# Patient Record
Sex: Male | Born: 1960 | Race: White | Marital: Married | Smoking: Never smoker
Health system: Southern US, Community
[De-identification: ages and names within clinical notes are randomized; demographics above are authoritative.]

## PROBLEM LIST (undated history)

## (undated) DIAGNOSIS — G4730 Sleep apnea, unspecified: Secondary | ICD-10-CM

## (undated) DIAGNOSIS — M519 Unspecified thoracic, thoracolumbar and lumbosacral intervertebral disc disorder: Secondary | ICD-10-CM

## (undated) DIAGNOSIS — E785 Hyperlipidemia, unspecified: Secondary | ICD-10-CM

## (undated) DIAGNOSIS — I10 Essential (primary) hypertension: Secondary | ICD-10-CM

## (undated) HISTORY — DX: Essential (primary) hypertension: I10

## (undated) HISTORY — DX: Hyperlipidemia, unspecified: E78.5

## (undated) HISTORY — PX: FINGER SURGERY: SHX640

## (undated) HISTORY — DX: Unspecified thoracic, thoracolumbar and lumbosacral intervertebral disc disorder: M51.9

## (undated) HISTORY — PX: TONSILLECTOMY: SUR1361

## (undated) HISTORY — DX: Sleep apnea, unspecified: G47.30

---

## 2020-02-19 ENCOUNTER — Ambulatory Visit: Payer: Self-pay | Attending: Internal Medicine

## 2020-02-19 ENCOUNTER — Other Ambulatory Visit: Payer: Self-pay

## 2020-02-19 DIAGNOSIS — Z23 Encounter for immunization: Secondary | ICD-10-CM

## 2020-02-19 NOTE — Progress Notes (Signed)
   Covid-19 Vaccination Clinic  Name:  Craig Curtis    MRN: [id number] DOB: [birth year]  02/19/2020  Mr. Craig Curtis was observed post Covid-19 immunization for 15 minutes without incident. He was provided with Vaccine Information Sheet and instruction to access the V-Safe system.   Mr. Craig Curtis was instructed to call 911 with any severe reactions post vaccine: Marland Kitchen Difficulty breathing  . Swelling of face and throat  . A fast heartbeat  . A bad rash all over body  . Dizziness and weakness   Immunizations Administered    Name Date Dose VIS Date Route   Pfizer COVID-19 Vaccine 02/19/2020  9:43 AM 0.3 mL 11/21/2019 Intramuscular   Manufacturer: [employer], [employer]   Lot: [id number]   NDC: [address]-4

## 2020-03-15 ENCOUNTER — Ambulatory Visit: Payer: Self-pay | Attending: Internal Medicine

## 2020-03-15 DIAGNOSIS — Z23 Encounter for immunization: Secondary | ICD-10-CM

## 2020-03-15 NOTE — Progress Notes (Signed)
   Covid-19 Vaccination Clinic  Name:  Craig Curtis    MRN: [id number] DOB: 12-Aug-1961  03/15/2020  [name] was observed post Covid-19 immunization for 15 minutes without incident. He was provided with Vaccine Information Sheet and instruction to access the V-Safe system.   [name] was instructed to call 911 with any severe reactions post vaccine: Marland Kitchen Difficulty breathing  . Swelling of face and throat  . A fast heartbeat  . A bad rash all over body  . Dizziness and weakness   Immunizations Administered    Name Date Dose VIS Date Route   Pfizer COVID-19 Vaccine 03/15/2020  9:28 AM 0.3 mL 11/21/2019 Intramuscular   Manufacturer: [employer], [employer]   Lot: [id number]   NDC: [address]-9

## 2021-10-27 ENCOUNTER — Other Ambulatory Visit: Payer: Self-pay | Admitting: Internal Medicine

## 2021-10-27 ENCOUNTER — Ambulatory Visit
Admission: RE | Admit: 2021-10-27 | Discharge: 2021-10-27 | Disposition: A | Discharge status: Home / Self Care | Payer: 59 | Source: Ambulatory Visit | Attending: Internal Medicine | Admitting: Internal Medicine

## 2021-10-27 DIAGNOSIS — R0609 Other forms of dyspnea: Secondary | ICD-10-CM

## 2021-12-15 DIAGNOSIS — R0602 Shortness of breath: Secondary | ICD-10-CM | POA: Insufficient documentation

## 2021-12-15 NOTE — Progress Notes (Addendum)
Cardiology Office Note   Date:  12/16/2021   ID:  Craig Curtis, DOB 1961/07/20, MRN [id number]  PCP:  Georgann Housekeeper, MD  Cardiologist:   Rollene Rotunda, MD Referring:  Georgann Housekeeper, MD  Chief Complaint  Patient presents with   Shortness of Breath      History of Present Illness: Craig Curtis is a 61 y.o. male who was referred by Georgann Housekeeper, MD for evaluation of SOB.  He says that he has been getting short of breath for some weeks or months.  He will notice it when he is climbing 17 stairs in his house.  He will get short of breath and takes a few minutes to recover.  He has not been particularly active.  He rarely gets on a recumbent bike.  His job is more sedentary.  He does not describe PND or orthopnea.  Does have sleep apnea and uses CPAP.  He has gained weight over time and he is often active.  He does not describe substernal chest pressure, neck or arm discomfort.  However, he does get some shooting chest discomfort occasionally and had this on 2 occasions.  This was a more sharp discomfort that was brief and 5/10.  He did not describe associated symptoms radiation to his jaw or to his arms.  He has not had any prior cardiac work-up or history.   Past Medical History:  Diagnosis Date   Dyslipidemia    HTN (hypertension)    Lumbar disc disease    Sleep apnea    CPAP   PSH: Tonsillectomy, mass removed from    Current Outpatient Medications  Medication Sig Dispense Refill   amLODipine (NORVASC) 5 MG tablet Take 5 mg by mouth daily.     aspirin EC 81 MG tablet Take 81 mg by mouth daily. Swallow whole.     atorvastatin (LIPITOR) 40 MG tablet Take 40 mg by mouth daily.     cholecalciferol (VITAMIN D3) 25 MCG (1000 UNIT) tablet Take 1,000 Units by mouth daily.     fenofibrate 54 MG tablet Take 54 mg by mouth daily.     lisinopril (ZESTRIL) 40 MG tablet Take 40 mg by mouth daily.     Melatonin 1 MG CHEW Chew 1 mg by mouth at bedtime as needed.     Multiple Vitamin  (MULTIVITAMIN) tablet Take 1 tablet by mouth daily.     psyllium (METAMUCIL) 58.6 % powder Take 1 packet by mouth 3 (three) times daily. TAKES THE POWDER OR GUMMIES     No current facility-administered medications for this visit.    Allergies:   Patient has no known allergies.    Social History:  The patient  reports that he has never smoked. He has never used smokeless tobacco. He reports that he does not drink alcohol.   Family History:  The patient's family history includes Coronary artery disease (age of onset: 64) in his mother; Diabetes in his mother; Hypertension in his mother.    ROS:  Please see the history of present illness.   Otherwise, review of systems are positive for none.   All other systems are reviewed and negative.    PHYSICAL EXAM: VS:  BP 138/70 (BP Location: Left Arm, Patient Position: Sitting, Cuff Size: Normal)    Pulse 95    Resp 20    Ht 6' (1.829 m)    Wt 280 lb 6.4 oz (127.2 kg)    SpO2 97%    BMI 38.03 kg/m  ,  BMI Body mass index is 38.03 kg/m. GENERAL:  Well appearing HEENT:  Pupils equal round and reactive, fundi not visualized, oral mucosa unremarkable NECK:  No jugular venous distention, waveform within normal limits, carotid upstroke brisk and symmetric, no bruits, no thyromegaly LYMPHATICS:  No cervical, inguinal adenopathy LUNGS:  Clear to auscultation bilaterally BACK:  No CVA tenderness CHEST:  Unremarkable HEART:  PMI not displaced or sustained,S1 and S2 within normal limits, no S3, no S4, no clicks, no rubs, no murmurs ABD:  Flat, positive bowel sounds normal in frequency in pitch, no bruits, no rebound, no guarding, no midline pulsatile mass, no hepatomegaly, no splenomegaly EXT:  2 plus pulses throughout, no edema, no cyanosis no clubbing SKIN:  No rashes no nodules NEURO:  Cranial nerves II through XII grossly intact, motor grossly intact throughout PSYCH:  Cognitively intact, oriented to person place and time    EKG:  EKG is ordered  today. The ekg ordered today demonstrates sinus rhythm, rate 69, axis within normal limits, intervals within normal limits, no acute ST-T wave changes.   Recent Labs: No results found for requested labs within last 8760 hours.    Lipid Panel No results found for: CHOL, TRIG, HDL, CHOLHDL, VLDL, LDLCALC, LDLDIRECT    Wt Readings from Last 3 Encounters:  12/16/21 280 lb 6.4 oz (127.2 kg)      Other studies Reviewed: Additional studies/ records that were reviewed today include: Labs and PCP note. Review of the above records demonstrates:  Please see elsewhere in the note.     ASSESSMENT AND PLAN:  SOB:  I am going to start with a coronary calcium score.  Pending this result I will either order a POET (Plain Old Exercise Treadmill) or other testing.  I am not suspecting heart failure.  This could be related to weight and deconditioning.  CHEST PAIN: He has had some mild discomfort sounds nonanginal but will be screened as above.  DYSLIPIDEMIA: LDL was 83 with an HDL of 39.  Goals of therapy will be based on the results of the above.  HTN: The blood pressure has been borderline.  Hopefully with diet and exercise he will get down to target more routinely.  OBESITY:  We had a long discussion about diet and exercise.    Current medicines are reviewed at length with the patient today.  The patient does not have concerns regarding medicines.  The following changes have been made:  no change  Labs/ tests ordered today include:   Orders Placed This Encounter  Procedures   CT CARDIAC SCORING (SELF PAY ONLY)   EKG 12-Lead     Disposition:   FU with me in one month.     Signed, Rollene Rotunda, MD  12/16/2021 3:05 PM    [facility]

## 2021-12-16 ENCOUNTER — Ambulatory Visit: Payer: 59 | Admitting: Cardiology

## 2021-12-16 ENCOUNTER — Encounter: Payer: Self-pay | Admitting: Cardiology

## 2021-12-16 ENCOUNTER — Other Ambulatory Visit: Payer: Self-pay

## 2021-12-16 VITALS — BP 138/70 | HR 95 | Resp 20 | Ht 72.0 in | Wt 280.4 lb

## 2021-12-16 DIAGNOSIS — R0602 Shortness of breath: Secondary | ICD-10-CM | POA: Diagnosis not present

## 2021-12-16 NOTE — Patient Instructions (Signed)
Medication Instructions:  Continue same medications   Lab Work: None ordered   Testing/Procedures: Coronary Calcium Score   Follow-Up: At [facility], you and your health needs are our priority.  As part of our continuing mission to provide you with exceptional heart care, we have created designated Provider Care Teams.  These Care Teams include your primary Cardiologist (physician) and Advanced Practice Providers (APPs -  Physician Assistants and Nurse Practitioners) who all work together to provide you with the care you need, when you need it.  We recommend signing up for the patient portal called "MyChart".  Sign up information is provided on this After Visit Summary.  MyChart is used to connect with patients for Virtual Visits (Telemedicine).  Patients are able to view lab/test results, encounter notes, upcoming appointments, etc.  Non-urgent messages can be sent to your provider as well.   To learn more about what you can do with MyChart, go to [email or web address].      Your next appointment:  1 month    The format for your next appointment: Office   Provider:  Dr.Hochrein

## 2022-01-11 ENCOUNTER — Ambulatory Visit (INDEPENDENT_AMBULATORY_CARE_PROVIDER_SITE_OTHER)
Admission: RE | Admit: 2022-01-11 | Discharge: 2022-01-11 | Disposition: A | Discharge status: Home / Self Care | Payer: Self-pay | Source: Ambulatory Visit | Attending: Cardiology | Admitting: Cardiology

## 2022-01-11 ENCOUNTER — Other Ambulatory Visit: Payer: Self-pay

## 2022-01-11 DIAGNOSIS — R0602 Shortness of breath: Secondary | ICD-10-CM

## 2022-01-13 ENCOUNTER — Encounter: Payer: Self-pay | Admitting: *Deleted

## 2022-01-13 DIAGNOSIS — R931 Abnormal findings on diagnostic imaging of heart and coronary circulation: Secondary | ICD-10-CM

## 2022-01-13 NOTE — Telephone Encounter (Signed)
pt aware of results  Order placed for gxt per dr croitoru

## 2022-01-13 NOTE — Telephone Encounter (Signed)
-----   Message from Mihai Croitoru, MD sent at 01/11/2022  5:18 PM EST ----- °Coronary calcium score is moderately elevated at 74th percentile, with virtually all the plaque concentrated in the LAD artery.  I think this qualifies for aggressive cholesterol lowering with a target LDL less than 70. °Waiting for results of stress test, but I think there will be a low threshold to consider further evaluation for proximal LAD stenosis.  If the stress test is abnormal, would probably recommend going straight to cardiac catheterization.  If the stress test is normal, coronary CT angiography may suffice.  Dr. Hochrein will make the final call. °

## 2022-01-16 NOTE — Telephone Encounter (Signed)
This encounter was created in error - please disregard.

## 2022-01-18 ENCOUNTER — Encounter: Payer: Self-pay | Admitting: *Deleted

## 2022-01-18 ENCOUNTER — Telehealth: Payer: Self-pay | Admitting: *Deleted

## 2022-01-18 DIAGNOSIS — R931 Abnormal findings on diagnostic imaging of heart and coronary circulation: Secondary | ICD-10-CM

## 2022-01-18 MED ORDER — METOPROLOL TARTRATE 100 MG PO TABS: ORAL_TABLET | ORAL | 0 refills | Status: AC

## 2022-01-18 NOTE — Telephone Encounter (Signed)
-----   Message from Thurmon Fair, MD sent at 01/11/2022  5:18 PM EST ----- Coronary calcium score is moderately elevated at [facility], with virtually all the plaque concentrated in the LAD artery.  I think this qualifies for aggressive cholesterol lowering with a target LDL less than 70. Waiting for results of stress test, but I think there will be a low threshold to consider further evaluation for proximal LAD stenosis.  If the stress test is abnormal, would probably recommend going straight to cardiac catheterization.  If the stress test is normal, coronary CT angiography may suffice.  Dr. Antoine Poche will make the final call.

## 2022-01-18 NOTE — Telephone Encounter (Signed)
Spoke with pt, he is aware that he needs a CTA instead of a gxt. CTA order placed and instructions discussed with the patient. Script for metoprolol sent to the pharmacy. Instructions printed and mailed to the patient, including lab orders.

## 2022-02-03 ENCOUNTER — Telehealth (HOSPITAL_COMMUNITY): Payer: Self-pay | Admitting: Emergency Medicine

## 2022-02-03 NOTE — Telephone Encounter (Signed)
Reaching out to patient to offer assistance regarding upcoming cardiac imaging study; pt verbalizes understanding of appt date/time, parking situation and where to check in, pre-test NPO status and medications ordered, and verified current allergies; name and call back number provided for further questions should they arise Rockwell Alexandria RN Navigator Cardiac Imaging [facility] [phone] office [phone] cell  Emailed instructions to him. Requested BMP be drawn either at [facility] office or PCP.  100mg  metoprolol 2 hr prior to  scan Denies iv issues Arrival 100

## 2022-02-06 ENCOUNTER — Ambulatory Visit: Payer: 59 | Admitting: Cardiology

## 2022-02-07 ENCOUNTER — Ambulatory Visit (HOSPITAL_COMMUNITY)
Admission: RE | Admit: 2022-02-07 | Discharge: 2022-02-07 | Disposition: A | Discharge status: Home / Self Care | Payer: 59 | Source: Ambulatory Visit | Attending: Cardiology | Admitting: Cardiology

## 2022-02-07 ENCOUNTER — Other Ambulatory Visit: Payer: Self-pay

## 2022-02-07 DIAGNOSIS — R931 Abnormal findings on diagnostic imaging of heart and coronary circulation: Secondary | ICD-10-CM

## 2022-02-07 DIAGNOSIS — I2510 Atherosclerotic heart disease of native coronary artery without angina pectoris: Secondary | ICD-10-CM | POA: Diagnosis not present

## 2022-02-07 MED ORDER — NITROGLYCERIN 0.4 MG SL SUBL
SUBLINGUAL_TABLET | SUBLINGUAL | Status: AC
  Filled 2022-02-07: qty 2

## 2022-02-07 MED ORDER — IOHEXOL 350 MG/ML SOLN
100.0000 mL | Freq: Once | INTRAVENOUS | Status: AC | PRN
Start: 2022-02-07 — End: 2022-02-07
  Administered 2022-02-07: 100 mL via INTRAVENOUS

## 2022-02-07 MED ORDER — NITROGLYCERIN 0.4 MG SL SUBL
0.8000 mg | SUBLINGUAL_TABLET | Freq: Once | SUBLINGUAL | Status: AC
  Administered 2022-02-07: 0.8 mg via SUBLINGUAL

## 2022-02-13 DIAGNOSIS — E785 Hyperlipidemia, unspecified: Secondary | ICD-10-CM | POA: Insufficient documentation

## 2022-02-13 DIAGNOSIS — I10 Essential (primary) hypertension: Secondary | ICD-10-CM | POA: Insufficient documentation

## 2022-02-13 NOTE — Progress Notes (Signed)
?  ?Cardiology Office Note ? ? ?Date:  02/14/2022  ? ?ID:  Craig Curtis, DOB 02-05-1961, MRN [id number] ? ?PCP:  Georgann Housekeeper, MD  ?Cardiologist:   Rollene Rotunda, MD ?Referring:  Georgann Housekeeper, MD ? ?Chief Complaint  ?Patient presents with  ? Shortness of Breath  ? ? ?  ?History of Present Illness: ?Craig Curtis is a 61 y.o. male who was referred by Georgann Housekeeper, MD for evaluation of SOB.   I sent him for a coronary calcium score that was 201 and 74th percentile.  CTA demonstrated non obstructive disease.  He says he has been doing better since I last saw him.  He is lost a lot of weight.  He has changed his diet to a much lower fat diet.  He is exercising routinely.  He thinks his breathing is a bit better.  His blood pressure is tracking a little bit lower but still not quite at target.  He is not having any chest pressure, neck or arm discomfort.  He is managing his stress a lot better.  He is actually going to be moving back to [place] and away from a very stressful job he currently has   ? ?Past Medical History:  ?Diagnosis Date  ? Dyslipidemia   ? HTN (hypertension)   ? Lumbar disc disease   ? Sleep apnea   ? CPAP  ? ? ?PSH: Tonsillectomy, mass removed from ? ? ?Current Outpatient Medications  ?Medication Sig Dispense Refill  ? aspirin EC 81 MG tablet Take 81 mg by mouth daily. Swallow whole.    ? atorvastatin (LIPITOR) 40 MG tablet Take 40 mg by mouth daily.    ? cholecalciferol (VITAMIN D3) 25 MCG (1000 UNIT) tablet Take 1,000 Units by mouth daily.    ? fenofibrate 54 MG tablet Take 54 mg by mouth daily.    ? lisinopril (ZESTRIL) 40 MG tablet Take 40 mg by mouth daily.    ? Melatonin 1 MG CHEW Chew 1 mg by mouth at bedtime as needed.    ? metoprolol tartrate (LOPRESSOR) 100 MG tablet Take 2 hours prior to CT scan 1 tablet 0  ? Multiple Vitamin (MULTIVITAMIN) tablet Take 1 tablet by mouth daily.    ? psyllium (METAMUCIL) 58.6 % powder Take 1 packet by mouth 3 (three) times daily. TAKES THE POWDER OR GUMMIES     ? amLODipine (NORVASC) 2.5 MG tablet Take 1 tablet (2.5 mg total) by mouth daily. TO BE TAKEN WITH THE 5 MG RX FOR TOTAL OF 7.5 MG DAILY 90 tablet 3  ? amLODipine (NORVASC) 5 MG tablet Take 1 tablet (5 mg total) by mouth daily. TO BE TAKEN WITH THE 2.5 MG RX FOR TOTAL OF 7.5 MG DAILY 90 tablet 3  ? ?No current facility-administered medications for this visit.  ? ? ?Allergies:   Patient has no known allergies.  ? ? ?ROS:  Please see the history of present illness.   Otherwise, review of systems are positive for none.   All other systems are reviewed and negative.  ? ? ?PHYSICAL EXAM: ?VS:  BP 138/68   Pulse 80   Ht 6' (1.829 m)   Wt 273 lb 6.4 oz (124 kg)   SpO2 97%   BMI 37.08 kg/m?  , BMI Body mass index is 37.08 kg/m?. ?GENERAL:  Well appearing ?NECK:  No jugular venous distention, waveform within normal limits, carotid upstroke brisk and symmetric, no bruits, no thyromegaly ?LUNGS:  Clear to auscultation bilaterally ?CHEST:  Unremarkable ?HEART:  PMI not displaced or sustained,S1 and S2 within normal limits, no S3, no S4, no clicks, no rubs, no murmurs ?ABD:  Flat, positive bowel sounds normal in frequency in pitch, no bruits, no rebound, no guarding, no midline pulsatile mass, no hepatomegaly, no splenomegaly ?EXT:  2 plus pulses throughout, no edema, no cyanosis no clubbing ? ? ?EKG:  EKG is not ordered today. ?The ekg ordered today demonstrates sinus rhythm, rate  ? ? ? ?Recent Labs: ?No results found for requested labs within last 8760 hours.  ? ? ?Lipid Panel ?No results found for: CHOL, TRIG, HDL, CHOLHDL, VLDL, LDLCALC, LDLDIRECT ?  ? ?Wt Readings from Last 3 Encounters:  ?02/14/22 273 lb 6.4 oz (124 kg)  ?12/16/21 280 lb 6.4 oz (127.2 kg)  ?  ? ? ?Other studies Reviewed: ?Additional studies/ records that were reviewed today include: None ?Review of the above records demonstrates:  Please see elsewhere in the note.   ? ? ?ASSESSMENT AND PLAN: ? ?SOB:     I am going toThis is slowly improving with  his weight loss.  I do not see a cardiac etiology.  No further work-up. ? ?CHEST PAIN:    He has had no further chest discomfort.  No change in therapy. ? ?DYSLIPIDEMIA: LDL was 79.  However, now he changed his diet and asked [place] to get this repeated in 3 months with a goal LDL of less than 50.  ? ?ELEVATED CORONARY CALCIUM: I had a long discussion with the patient about this.  He is doing an excellent job on primary risk reduction.  I suggested maybe another coronary calcium score in 5 years. ? ?HTN: The blood pressure was I am going to increase his Norvasc to 7.5 mg daily.  ? ?OBESITY:   He has lost quite a bit of weight.  No change in therapy. ? ? ? ?Current medicines are reviewed at length with the patient today.  The patient does not have concerns regarding medicines. ? ?The following changes have been made: As above ? ?Labs/ tests ordered today include:  None ? ?No orders of the defined types were placed in this encounter. ? ? ? ?Disposition:   FU with me as needed.    He is likely moving back to [place]. ? ? ?Signed, ?Rollene Rotunda, MD  ?02/14/2022 3:47 PM    ?[facility] ? ? ? ?

## 2022-02-14 ENCOUNTER — Ambulatory Visit: Payer: 59 | Admitting: Cardiology

## 2022-02-14 ENCOUNTER — Encounter: Payer: Self-pay | Admitting: Cardiology

## 2022-02-14 ENCOUNTER — Other Ambulatory Visit: Payer: Self-pay

## 2022-02-14 VITALS — BP 138/68 | HR 80 | Ht 72.0 in | Wt 273.4 lb

## 2022-02-14 DIAGNOSIS — I10 Essential (primary) hypertension: Secondary | ICD-10-CM

## 2022-02-14 DIAGNOSIS — R931 Abnormal findings on diagnostic imaging of heart and coronary circulation: Secondary | ICD-10-CM | POA: Diagnosis not present

## 2022-02-14 DIAGNOSIS — E785 Hyperlipidemia, unspecified: Secondary | ICD-10-CM | POA: Diagnosis not present

## 2022-02-14 MED ORDER — AMLODIPINE BESYLATE 5 MG PO TABS: 5.0000 mg | ORAL_TABLET | Freq: Every day | ORAL | 3 refills | Status: DC

## 2022-02-14 MED ORDER — AMLODIPINE BESYLATE 2.5 MG PO TABS: 2.5000 mg | ORAL_TABLET | Freq: Every day | ORAL | 11 refills | Status: DC

## 2022-02-14 MED ORDER — AMLODIPINE BESYLATE 2.5 MG PO TABS: 2.5000 mg | ORAL_TABLET | Freq: Every day | ORAL | 3 refills | Status: DC

## 2022-02-14 NOTE — Patient Instructions (Signed)
Medication Instructions:  ?INCREASE YOUR AMLODIPINE TO 7.5 MG DAILY (1 5 MG TABLET AND 1 2.5 MG TABLET) ? ?Labwork: ?NONE ? ?Testing/Procedures: ?NONE ? ?Follow-Up: ?AS NEEDED  ? ?

## 2023-01-02 IMAGING — CR DG CHEST 2V
2 series · 2 of 2 positions shown · non-contrast
Comparison: None.

CLINICAL DATA: Dyspnea on exertion.  Hypertension.

EXAM:
CHEST - 2 VIEW

[w chest pa *]
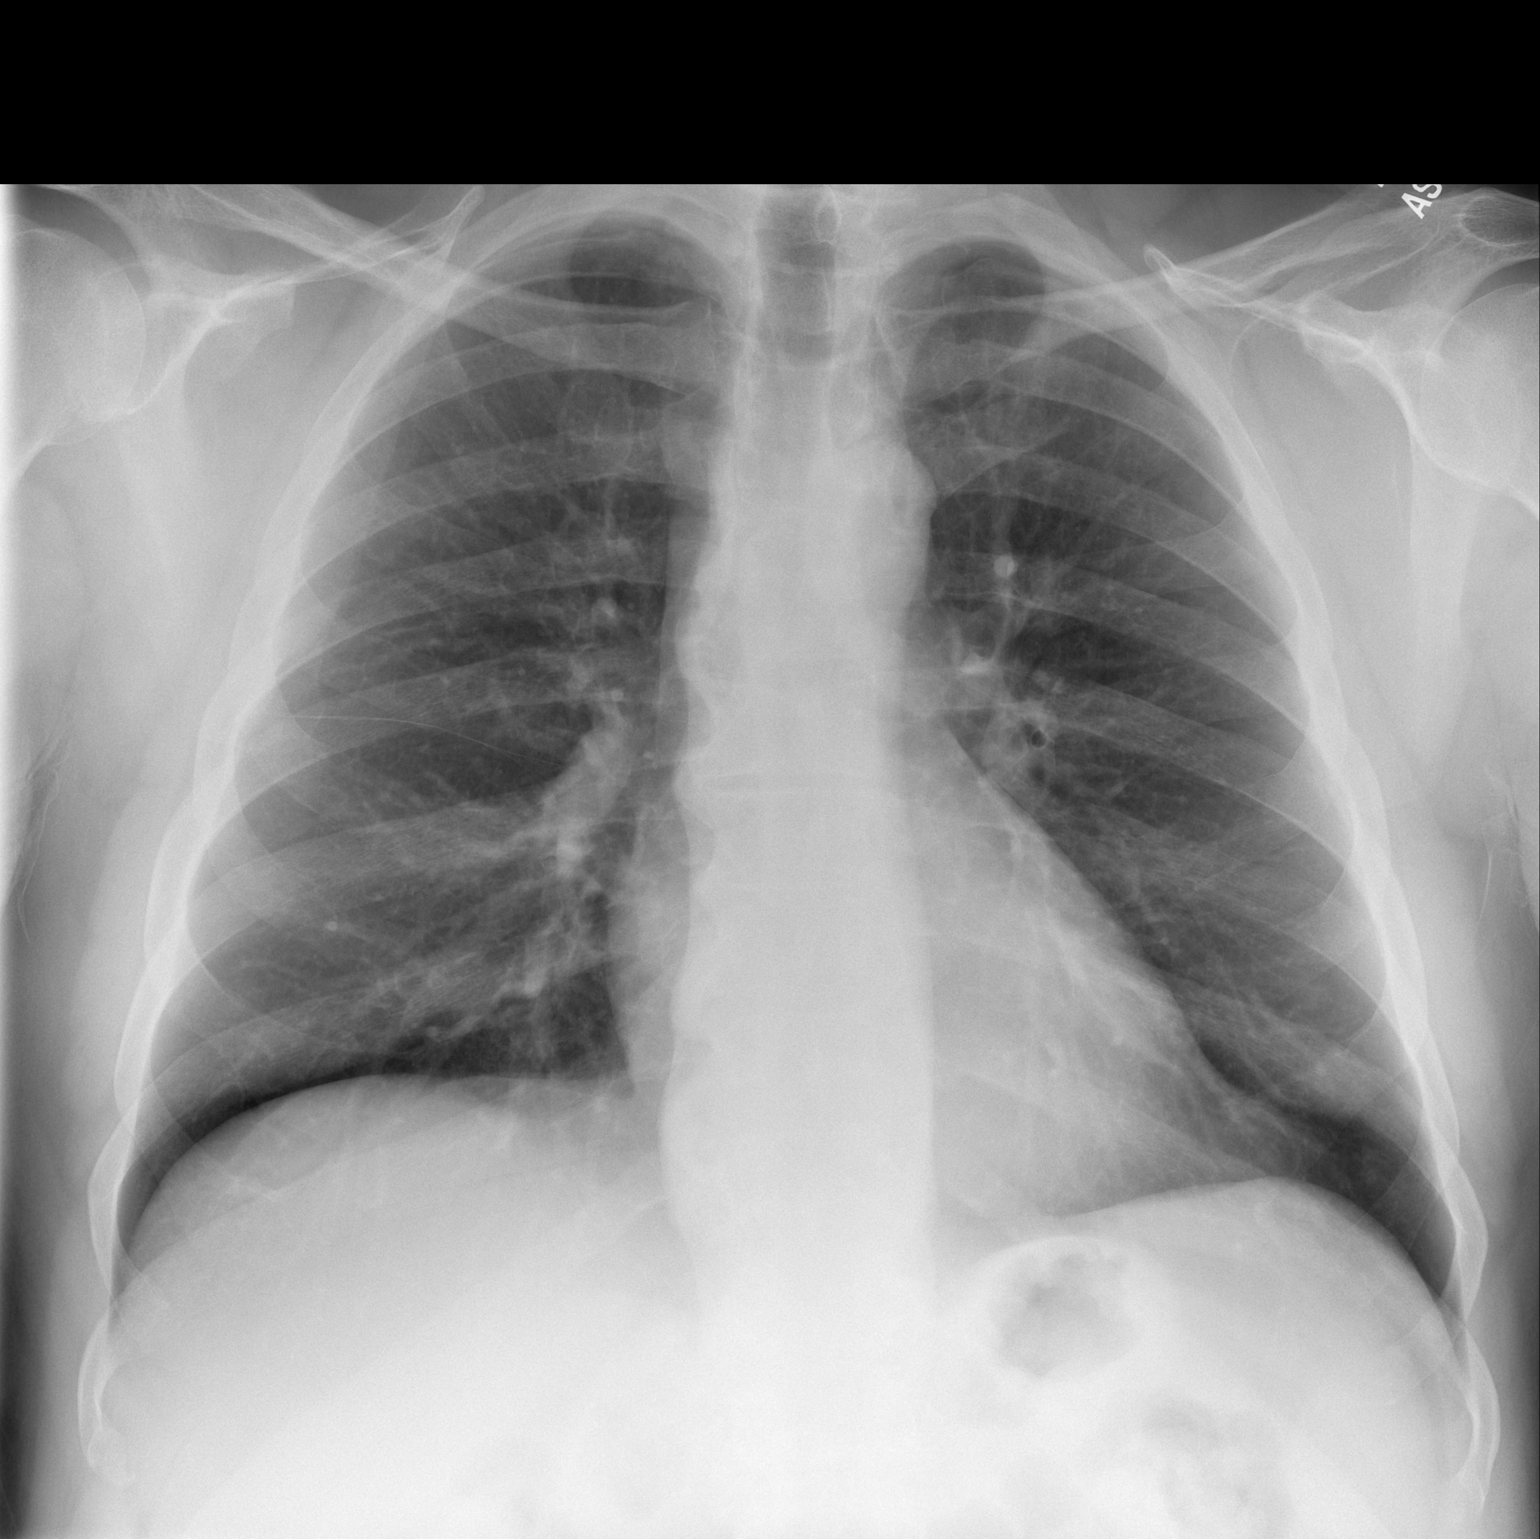

[w chest lat *]
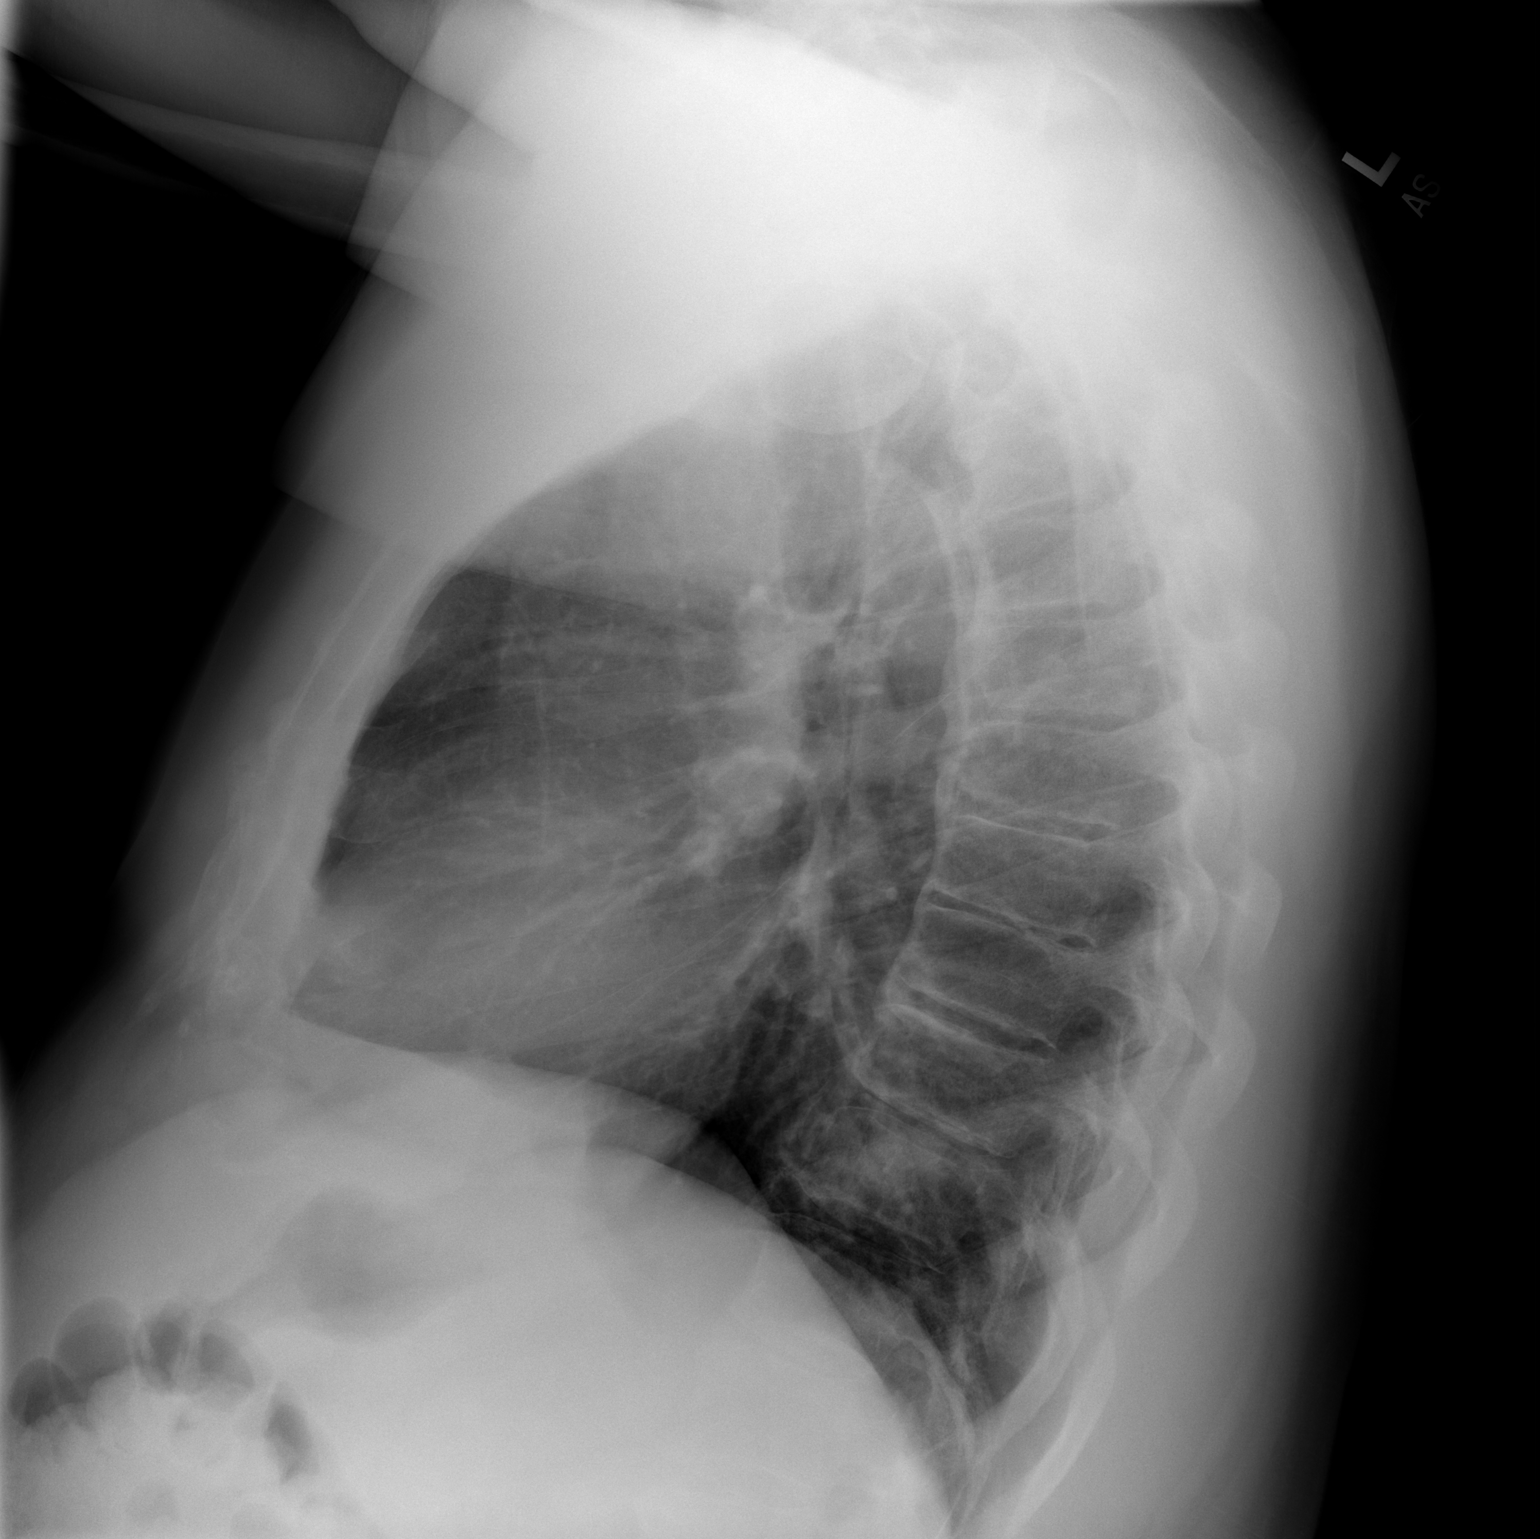

[2 of 2 positions shown; findings below may reference images not displayed]

FINDINGS: The heart size and mediastinal contours are within normal limits.
Both lungs are clear. The visualized skeletal structures are
unremarkable.
IMPRESSION: No active cardiopulmonary disease.

## 2023-02-16 ENCOUNTER — Other Ambulatory Visit: Payer: Self-pay | Admitting: Cardiology

## 2023-03-05 ENCOUNTER — Inpatient Hospital Stay
Admit: 2023-03-05 | Discharge: 2023-03-06 | Disposition: A | Discharge status: Home / Self Care | Payer: PRIVATE HEALTH INSURANCE

## 2023-03-05 ENCOUNTER — Emergency Department: Admit: 2023-03-05 | Payer: PRIVATE HEALTH INSURANCE

## 2023-03-05 DIAGNOSIS — L02212 Cutaneous abscess of back [any part, except buttock]: Principal | ICD-10-CM

## 2023-03-05 DIAGNOSIS — L0291 Cutaneous abscess, unspecified: Secondary | ICD-10-CM

## 2023-03-05 LAB — CBC
Hematocrit: 36.4 % — ABNORMAL LOW (ref 38.5–50.0)
Hemoglobin: 12.4 g/dL — ABNORMAL LOW (ref 13.2–17.1)
MCH: 28.9 pg (ref 27.0–33.0)
MCHC: 34.1 g/dL (ref 32.0–36.0)
MCV: 84.8 fL (ref 80.0–100.0)
MPV: 9.1 fL (ref 7.5–11.5)
Platelets: 174 10*3/uL (ref 140–400)
RBC: 4.3 10*6/uL (ref 4.20–5.80)
RDW: 13.3 % (ref 11.0–15.0)
WBC: 7.6 10*3/uL (ref 3.8–10.8)

## 2023-03-05 LAB — BASIC METABOLIC PANEL
Anion Gap: 6 mmol/L (ref 3–16)
BUN: 13 mg/dL (ref 7–25)
CO2: 28 mmol/L (ref 21–33)
Calcium: 9.2 mg/dL (ref 8.6–10.3)
Chloride: 105 mmol/L (ref 98–110)
Creatinine: 1.19 mg/dL (ref 0.60–1.30)
EGFR: 69
Glucose: 106 mg/dL — ABNORMAL HIGH (ref 70–100)
Osmolality, Calculated: 289 mOsm/kg (ref 278–305)
Potassium: 3.9 mmol/L (ref 3.5–5.3)
Sodium: 139 mmol/L (ref 133–146)

## 2023-03-05 LAB — SED RATE: Sed Rate: 60 mm/hr — ABNORMAL HIGH (ref 0–20)

## 2023-03-05 LAB — DIFFERENTIAL
Basophils Absolute: 23 /uL (ref 0–200)
Basophils Relative: 0.3 % (ref 0.0–1.0)
Eosinophils Absolute: 46 /uL (ref 15–500)
Eosinophils Relative: 0.6 % (ref 0.0–8.0)
Lymphocytes Absolute: 1155 /uL (ref 850–3900)
Lymphocytes Relative: 15.2 % (ref 15.0–45.0)
Monocytes Absolute: 585 /uL (ref 200–950)
Monocytes Relative: 7.7 % (ref 0.0–12.0)
Neutrophils Absolute: 5791 /uL (ref 1500–7800)
Neutrophils Relative: 76.2 % (ref 40.0–80.0)
nRBC: 0 /100 WBC (ref 0–0)

## 2023-03-05 LAB — C-REACTIVE PROTEIN: CRP: 66.3 mg/L — ABNORMAL HIGH (ref 1.0–10.0)

## 2023-03-05 MED ORDER — lidocaine-EPINEPHrine 1 %-1:100,000 injection 1 mL
1 | Freq: Once | INTRAMUSCULAR | Status: AC
Start: 2023-03-05 — End: 2023-03-05
  Administered 2023-03-05: 1 mL via INTRADERMAL

## 2023-03-05 MED ORDER — doxycycline (VIBRAMYCIN) 100 MG capsule
100 | ORAL_CAPSULE | Freq: Two times a day (BID) | ORAL | 0 refills
Start: 2023-03-05 — End: 2023-03-09

## 2023-03-05 MED ORDER — midazolam (PF) (VERSED) injection 2 mg
1 | Freq: Once | INTRAMUSCULAR | Status: AC
Start: 2023-03-05 — End: 2023-03-05

## 2023-03-05 MED ORDER — ondansetron (ZOFRAN) injection 4 mg
4 | Freq: Once | INTRAMUSCULAR | Status: AC
Start: 2023-03-05 — End: 2023-03-05
  Administered 2023-03-05: 16:00:00 4 mg via INTRAVENOUS

## 2023-03-05 MED ORDER — gadobutrol (GADAVIST) 1 mmol/1 mL IV solution 10 mL
1 | Freq: Once | INTRAVENOUS | Status: AC | PRN
Start: 2023-03-05 — End: 2023-03-05
  Administered 2023-03-05: 21:00:00 10 mL/kg via INTRAVENOUS

## 2023-03-05 MED ORDER — cephalexin 500 mg tablet
500 | ORAL_TABLET | Freq: Four times a day (QID) | ORAL | 0 refills | 7.00000 days | Status: AC
Start: 2023-03-05 — End: 2023-03-12

## 2023-03-05 MED ORDER — HYDROcodone-acetaminophen (NORCO) 5-325 mg per tablet
5-325 | ORAL_TABLET | Freq: Four times a day (QID) | ORAL | 0 refills | 15.50000 days | Status: AC | PRN
Start: 2023-03-05 — End: 2023-03-08

## 2023-03-05 MED ORDER — ibuprofen (MOTRIN) 200 MG tablet
200 | ORAL_TABLET | Freq: Four times a day (QID) | ORAL | 0 refills | Status: AC | PRN
Start: 2023-03-05 — End: ?

## 2023-03-05 MED ORDER — HYDROmorphone (DILAUDID) injection Syrg 1 mg
1 | Freq: Once | INTRAMUSCULAR | Status: AC
Start: 2023-03-05 — End: 2023-03-05
  Administered 2023-03-05: 19:00:00 1 mg via INTRAVENOUS

## 2023-03-05 MED ORDER — midazolam (PF) (VERSED) 1 mg/mL injection
1 | INTRAMUSCULAR | Status: AC
Start: 2023-03-05 — End: 2023-03-05
  Administered 2023-03-05: 20:00:00 2 via INTRAVENOUS

## 2023-03-05 MED ORDER — HYDROmorphone (DILAUDID) injection Syrg 1 mg
1 | Freq: Once | INTRAMUSCULAR | Status: AC
Start: 2023-03-05 — End: 2023-03-05
  Administered 2023-03-05: 16:00:00 1 mg via INTRAVENOUS

## 2023-03-05 MED FILL — ONDANSETRON HCL (PF) 4 MG/2 ML INJECTION SOLUTION: 4 4 mg/2 mL | INTRAMUSCULAR | Qty: 2

## 2023-03-05 MED FILL — XYLOCAINE WITH EPINEPHRINE 1 %-1:100,000 INJECTION SOLUTION: 1 1 %-1:100,000 | INTRAMUSCULAR | Qty: 20

## 2023-03-05 MED FILL — HYDROMORPHONE 1 MG/ML INJECTION SYRINGE: 1 1 mg/mL | INTRAMUSCULAR | Qty: 1

## 2023-03-05 MED FILL — MIDAZOLAM (PF) 1 MG/ML INJECTION SOLUTION: 1 1 mg/mL | INTRAMUSCULAR | Qty: 2

## 2023-03-05 NOTE — Discharge Instructions (Signed)
Stop augmentin. Take cephalexin and doxycycline as directed until gone. Follow-up with Surgery for definitive removal of the cyst. Return to the ER for any worsening symptoms or emergent concerns.

## 2023-03-05 NOTE — ED Notes (Signed)
Pt back from MRI.

## 2023-03-05 NOTE — ED Triage Notes (Signed)
Pt has a cyst on his back with 9/10 pain ongoing for about a week.

## 2023-03-05 NOTE — ED Notes (Signed)
Pt to MRI.

## 2023-03-05 NOTE — ED Provider Notes (Addendum)
ED Attending Attestation Note    Date of service:  03/05/2023    This patient was seen by the advanced practice provider.  I have seen and examined the patient, agree with the workup, evaluation, management and diagnosis.  The care plan has been discussed and I concur.      My assessment reveals a 62 y.o. male who presents with concern for an abscess.  He states that he has had a mass in his lower spine for 10 to 15 years.  He states that since Thursday of last week the mass has gotten larger, more painful and red and angry.  He does report that he was seen by his dentist in [place] and was started on amoxicillin and then transition to Augmentin.  This was for cracked tooth.  The patient is in process of moving from [place] to [place].      Upon exam, Large fluctuant mass over L-spine with midline spinal tenderness and spontaneous pustular drainage. Because of the location and size of the mass, we will obtain imaging.  Initially ordered a CT of the lumbar spine but radiology would prefer that we obtain an MRI.      Scribe Attestation  By signing my name below, I, Lamarr Lulas, attest that this documentation has been prepared under the direction and in the presence of K. Lora Havens, MD, MBA.   Scribe name: Lamarr Lulas Date: 03/05/2023  ----    Attending Physician Attestation   The scribes documentation has been prepared under my direction and personally reviewed by me in its entirety. I confirm that the note above accurately reflects all work, treatment, procedures, and medical decision making performed by me.        12:29 PM, 03/05/2023

## 2023-03-05 NOTE — ED Notes (Signed)
Pt discharged to home in good condition. Discharge instructions provided to patient and explained. Patient verbalized understanding. Medications reviewed and all of patient's questions and concerns answered. Pt A&Ox4, no signs of acute distress noted. Skin pink warm and dry. Pt ambulated out of ED without assistance

## 2023-03-05 NOTE — ED Notes (Signed)
PA at bedside.

## 2023-03-05 NOTE — ED Notes (Signed)
Bed: B11W  Expected date:   Expected time:   Means of arrival:   Comments:  FW

## 2023-03-05 NOTE — ED Provider Notes (Signed)
[facility] ED Note  Date of Service: 03/05/2023  Reason for Visit: Abscess      Patient History     HPI:  Ronnie Collins is a 62 y.o. male who presents to the Emergency Department with a chief complaint of abscess.  Patient presents with an abscess to his lower back.  Patient reports he has had a bump there for years but it became inflamed and painful on Wednesday but is rapidly worsening.  Patient has pain at rest but is worsened with movement and walking.  No fever at home.  Patient is currently on amoxicillin.  Otherwise well.  No history of diabetes or IV drug use.       Past Medical History:   Diagnosis Date    Hypertension        No past surgical history on file.    No family history on file.    Jeris Penta Shams  has no history on file for tobacco use, alcohol use, and drug use.    Previous Medications    AMLODIPINE (NORVASC) 5 MG TABLET    Take 1.5 tablets (7.5 mg total) by mouth at bedtime.    ASPIRIN 81 MG CAP    Take 81 mg by mouth at bedtime.    ATORVASTATIN (LIPITOR) 40 MG TABLET    Take 1 tablet (40 mg total) by mouth at bedtime.    CHOLECALCIFEROL, VITAMIN D3, (VITAMIN D3) 50 MCG (2,000 UNIT) CAP    Take by mouth daily.    FENOFIBRATE MICRONIZED (TRICOR) 54 MG TABLET    Take 1 tablet (54 mg total) by mouth daily.    LISINOPRIL (PRINIVIL) 40 MG TABLET    Take 1 tablet (40 mg total) by mouth daily.    LORATADINE (CLARITIN) 10 MG TABLET    Take 1 tablet (10 mg total) by mouth daily.    MV-MIN-FOLIC-K1-LYCOPEN-LUTEIN (CENTRUM SILVER MEN) [id number] MCG TAB    Take by mouth daily.       Allergies:   Allergies as of 03/05/2023    (No Known Allergies)       Review of Systems     Review of Systems   Constitutional: Negative.    Respiratory: Negative.     Cardiovascular: Negative.    Musculoskeletal: Negative.    Skin:         +Abscess   Neurological: Negative.    Psychiatric/Behavioral: Negative.     All other systems reviewed and are negative.          Physical Exam     General: Well-developed well-nourished  in no acute distress.  HEENT: Head normocephalic atraumatic.  Pupils equal and round.  External ears and nose normal.   Neck: Full range of motion, supple.  Pulmonary: Lungs clear to auscultation bilaterally.  No wheezing, rhonchi, or rales.  Cardiac: Regular rate and rhythm.   Musculoskeletal: Moving all extremities appropriately, no obvious weakness noted.  Erythema, induration across the lumbar spine with an area of fluctuance and mild purulent drainage overlying the lumbar vertebrae.  Vascular: Palpable pulses to all extremities.  No pitting edema noted.  Skin: Warm and dry.  Neuro: Alert and oriented.  Cranial nerves II through XII without deficit.    Psych:  Normal affect, Normal judgement, Normal mood.  Normal affect, and behavior.    ED Course and MDM     MEDICAL DECISION MAKING    RECENT VITALS:  BP: 145/82, Temp: 99.1 F (37.3 C), Heart Rate: 72, Resp: 20  RADIOLOGY:  MRI Lumbar spine W WO contrast   Final Result   IMPRESSION:      1.  Rim-enhancing loculated fluid collection in the posterior midline lumbar subcutaneous soft tissues measuring up to 4 x 3 x 3 cm with differential including a subcutaneous abscess or infected sebaceous cyst. No associated deep myofascial or paraspinal soft tissue extension.   2.  Negative for epidural abscess or abnormal intrathecal enhancement.   3.  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            Report Verified by: Welford Roche, MD at 03/05/2023 7:12 PM EDT          LABS:   Labs Reviewed   BASIC METABOLIC PANEL - Abnormal; Notable for the following components:       Result Value    Glucose 106 (*)     All other components within normal limits   CBC - Abnormal; Notable for the following components:    Hemoglobin 12.4 (*)     Hematocrit 36.4 (*)     All other components within normal limits   SED RATE - Abnormal; Notable for the following components:    Sed Rate 60 (*)     All other components within normal limits   C-REACTIVE  PROTEIN - Abnormal; Notable for the following components:    CRP 66.3 (*)     All other components within normal limits   DIFFERENTIAL       MEDS:  Medications   lidocaine-EPINEPHrine 1 %-1:100,000 injection 1 mL (has no administration in time range)   HYDROmorphone (DILAUDID) injection Syrg 1 mg (1 mg Intravenous Given 03/05/23 1226)   ondansetron (ZOFRAN) injection 4 mg (4 mg Intravenous Given 03/05/23 1225)   HYDROmorphone (DILAUDID) injection Syrg 1 mg (1 mg Intravenous Given 03/05/23 1445)   midazolam (PF) (VERSED) injection 2 mg (2 mg Intravenous Given 03/05/23 1545)   gadobutrol (GADAVIST) 1 mmol/1 mL IV solution 10 mL (10 mLs Intravenous Given 03/05/23 1703)       PROCEDURES: N/A  Incision/Drainage    Date/Time: 03/05/2023 7:44 PM    Performed by: Idamae Lusher, PA  Authorized by: Beverely Low, MD  Consent: Verbal consent obtained.  Risks and benefits: risks, benefits and alternatives were discussed  Consent given by: patient  Patient understanding: patient states understanding of the procedure being performed  Imaging studies: imaging studies available  Required items: required blood products, implants, devices, and special equipment available  Patient identity confirmed: arm band  Time out: Immediately prior to procedure a time out was called to verify the correct patient, procedure, equipment, support staff and site/side marked as required.  Type: abscess  Body area: trunk  Location details: back  Anesthesia: local infiltration    Anesthesia:  Local Anesthetic: lidocaine 1% with epinephrine  Anesthetic total: 5 mL    Sedation:  Patient sedated: no    Scalpel size: 11  Incision type: single straight  Incision depth: dermal  Complexity: simple  Drainage: purulent and bloody (sebaceous)  Drainage amount: moderate  Wound treatment: wound left open  Patient tolerance: patient tolerated the procedure well with no immediate complications          CONSULTS:  None    MEDICAL DECISION MAKING / ED  COURSE:    The patient was seen and examined by myself and presented to Dr. Beverely Low,*, who also examined the patient and agrees with assessment and plan.  Ronnie Collins is a 62 y.o. male who presents to the Emergency Department with a chief complaint of abscess.  Patient is well-appearing and in no acute distress.  Vital stable.  Patient has an abscess to his lumbar region with significant surrounding cellulitis and induration.  The abscess is directly overlying the vertebrae and there is concern for extension to deeper structures.  In speaking with radiology, an MRI with contrast is recommended.  MRI confirms abscess that does not affect any deep structures.  An I&D was performed.  Patient be treated with Keflex and Doxy.  Instructed to follow-up with surgery as this is a sebaceous cyst that required definitive management by surgery.  Return precautions discussed.    Medical Decision Making  Problems Addressed:  Abscess: complicated acute illness or injury    Amount and/or Complexity of Data Reviewed  Labs: ordered.  Radiology: ordered.    Risk  OTC drugs.  Prescription drug management.        The patient tolerated their visit well.  They were seen and evaluated by the attending physician who agreed with the assessment and plan.   The patient and / or the family were informed of the results of any tests, a time was given to answer questions, a plan was proposed and they agreed with plan.     DISCHARGE DIAGNOSIS:  1. Abscess        PATIENT REFERRED TO:  Jeani Hawking, MD  [address]  [place] [address]    Schedule an appointment as soon as possible for a visit       [facility] Emergency Department  [address]  [place] [address]  [phone]    As needed, If symptoms worsen      DISCHARGE MEDICATIONS:  New Prescriptions    CEPHALEXIN 500 MG TABLET    Take 500 mg by mouth 4 times a day for 7 days.    DOXYCYCLINE (VIBRAMYCIN) 100 MG CAPSULE    Take 1  capsule (100 mg total) by mouth 2 times a day for 7 days.    HYDROCODONE-ACETAMINOPHEN (NORCO) 5-325 MG PER TABLET    Take 1 tablet by mouth every 6 hours as needed for Pain for up to 3 days.    IBUPROFEN (MOTRIN) 200 MG TABLET    Take 3 tablets (600 mg total) by mouth every 6 hours as needed for Pain.         Critical Care Time (Attendings)          Kathyrn Sheriff, [place]  03/05/23 1945

## 2023-03-09 ENCOUNTER — Ambulatory Visit: Admit: 2023-03-09 | Payer: PRIVATE HEALTH INSURANCE

## 2023-03-09 ENCOUNTER — Ambulatory Visit: Admit: 2023-03-09 | Discharge: 2023-03-09 | Payer: PRIVATE HEALTH INSURANCE

## 2023-03-09 DIAGNOSIS — L0291 Cutaneous abscess, unspecified: Secondary | ICD-10-CM

## 2023-03-09 LAB — ROUTINE CULTURE PLUS STAIN: Culture Result: NO GROWTH

## 2023-03-09 MED ORDER — doxycycline (VIBRAMYCIN) 100 MG capsule
100 | ORAL_CAPSULE | Freq: Two times a day (BID) | ORAL | 0 refills | Status: AC
Start: 2023-03-09 — End: 2023-03-16

## 2023-03-09 MED ORDER — cephalexin 500 mg tablet
500 | ORAL_TABLET | Freq: Four times a day (QID) | ORAL | 0 refills | Status: AC
Start: 2023-03-09 — End: 2023-03-16

## 2023-03-09 NOTE — Progress Notes (Signed)
Chief Complaint:    Chief Complaint   Patient presents with    New Patient Visit/ Consultation     Abscess of back; ed ref I&D 03/25       Subjective   HPI:   Patient ID: Ronnie Collins is a 62 y.o. male.  HPI  Pt is a 62yo male, presents with infection of posterior back mass - notes has had mass present for years on mid lower back.  States had recent dental infection and was on antibiotics - developed increased swelling, redness, and tenderness at site of prior small mass - went to ED and underwent I+D. Notes improvement but persistent swelling, redness and tenderness. Applying dry dressing daily. On doxy and keflex for 7d course. Notes high pain tolerance but still c/o symptoms.  No fevers/chills.        Allergies  Patient has no known allergies.    Medications  Outpatient Encounter Medications as of 03/09/2023   Medication Sig Dispense Refill    amLODIPine (NORVASC) 5 MG tablet Take 1.5 tablets (7.5 mg total) by mouth at bedtime.      aspirin 81 mg Cap Take 81 mg by mouth at bedtime.      atorvastatin (LIPITOR) 40 MG tablet Take 1 tablet (40 mg total) by mouth at bedtime.      cephalexin 500 mg tablet Take 500 mg by mouth 4 times a day for 7 days. 28 tablet 0    cholecalciferol, vitamin D3, (VITAMIN D3) 50 mcg (2,000 unit) Cap Take by mouth daily.      doxycycline (VIBRAMYCIN) 100 MG capsule Take 1 capsule (100 mg total) by mouth 2 times a day for 7 days. 14 capsule 0    fenofibrate micronized (TRICOR) 54 MG tablet Take 1 tablet (54 mg total) by mouth daily.      [EXPIRED] HYDROcodone-acetaminophen (NORCO) 5-325 mg per tablet Take 1 tablet by mouth every 6 hours as needed for Pain for up to 3 days. 12 tablet 0    ibuprofen (MOTRIN) 200 MG tablet Take 3 tablets (600 mg total) by mouth every 6 hours as needed for Pain. 30 tablet 0    lisinopriL (PRINIVIL) 40 MG tablet Take 1 tablet (40 mg total) by mouth daily.      loratadine (CLARITIN) 10 mg tablet Take 1 tablet (10 mg total) by mouth daily.       mv-min-folic-K1-lycopen-lutein (CENTRUM SILVER MEN) 300-60-600-300 mcg Tab Take by mouth daily.       No facility-administered encounter medications on file as of 03/09/2023.        Histories  He has a past medical history of Hypertension.    He has no past surgical history on file.    His family history is not on file.    He denies tobacco, significant ETOH use.     The following portions of the patient's history were reviewed and updated as appropriate: allergies, current medications, past family history, past medical history, past social history, past surgical history, problem list, and review of systems.    ROS:   Review of Systems   Musculoskeletal:  Positive for back pain.   Skin:  Positive for wound.   All other systems reviewed and are negative.      Objective:   Physical Exam  Constitutional:       General: He is not in acute distress.     Appearance: Normal appearance. He is not ill-appearing.   HENT:      Head:  Normocephalic and atraumatic.   Eyes:      General: No scleral icterus.     Conjunctiva/sclera: Conjunctivae normal.   Pulmonary:      Effort: Pulmonary effort is normal. No respiratory distress.   Abdominal:      Palpations: Abdomen is soft.   Skin:     General: Skin is warm.      Coloration: Skin is not jaundiced.      Comments: Mid lower back with erythema, induration, central fluctuance with small stab wound to infected sebaceous cyst   Neurological:      Mental Status: He is alert.          Assessment/Plan:   Infected sebaceous cyst with abscess and surrounding cellulitis -   Concern for inadequate drainage. Informed consent obtained for incision and drainage -   Packing placed - instructed for removal of packing in 24 hours. Recommend extension of antibiotic course for 2weeks total given extent of cellulitis.  Follow-up with me next week for wound check.  Will need excision of mass in future to prevent recurrence.     Procedure note:  Consent obtained. Area prepped with betadine. Local  anesthetic injected and incision expanded with drainage of copious sebum, purulence.  Culture sent.  Expressed remaining cyst contents completely and wound irrigated.  Packed with 1/4inch packing tape and dressed with dry dressing.  Tolerated well.     Garen Grams, MD  General Surgeon  [facility]      03/15/2023  2:06 PM

## 2023-03-16 ENCOUNTER — Ambulatory Visit: Admit: 2023-03-16 | Discharge: 2023-03-16 | Payer: PRIVATE HEALTH INSURANCE

## 2023-03-16 DIAGNOSIS — L0291 Cutaneous abscess, unspecified: Secondary | ICD-10-CM

## 2023-03-16 NOTE — Progress Notes (Signed)
Chief Complaint:    Chief Complaint   Patient presents with    Follow-up     Incision drainage       Subjective   HPI:   Patient ID: Ronnie Collins is a 62 y.o. male.  HPI  Pt is a 62yo male, here for follow-up after I+D of infected sebaceous cyst - notes continued improvement of area.  Drainage improved. Pain and swelling improved. Still on antibiotics.   Also would like epigastric hernia evaluated - notes present for years. Increased in size. Relatively asymptomatic. No N/V. No issues with BMs. No prior abdominal surgeries.     Allergies  Patient has no known allergies.    Medications  Outpatient Encounter Medications as of 03/16/2023   Medication Sig Dispense Refill    amLODIPine (NORVASC) 5 MG tablet Take 1.5 tablets (7.5 mg total) by mouth at bedtime.      aspirin 81 mg Cap Take 81 mg by mouth at bedtime.      atorvastatin (LIPITOR) 40 MG tablet Take 1 tablet (40 mg total) by mouth at bedtime.      cephalexin 500 mg tablet Take 500 mg by mouth 4 times a day for 7 days. Additional 7 day supply to complete 14 day course 28 tablet 0    cholecalciferol, vitamin D3, (VITAMIN D3) 50 mcg (2,000 unit) Cap Take by mouth daily.      doxycycline (VIBRAMYCIN) 100 MG capsule Take 1 capsule (100 mg total) by mouth 2 times a day for 7 days. Additional 7 day supply to complete 14 day course 14 capsule 0    fenofibrate micronized (TRICOR) 54 MG tablet Take 1 tablet (54 mg total) by mouth daily.      ibuprofen (MOTRIN) 200 MG tablet Take 3 tablets (600 mg total) by mouth every 6 hours as needed for Pain. 30 tablet 0    lisinopriL (PRINIVIL) 40 MG tablet Take 1 tablet (40 mg total) by mouth daily.      loratadine (CLARITIN) 10 mg tablet Take 1 tablet (10 mg total) by mouth daily.      mv-min-folic-K1-lycopen-lutein (CENTRUM SILVER MEN) 300-60-600-300 mcg Tab Take by mouth daily.       No facility-administered encounter medications on file as of 03/16/2023.        Histories  He has a past medical history of Hypertension.    He has no  past surgical history     His family history is not significant    He reports that he has never smoked. He has never used smokeless tobacco.    The following portions of the patient's history were reviewed and updated as appropriate: allergies, current medications, past family history, past medical history, past social history, past surgical history, problem list, and review of systems.    ROS:   Review of Systems   Constitutional:  Negative for weight gain and weight loss.   Cardiovascular:  Negative for chest pain.   Musculoskeletal:  Positive for back pain. Negative for neck pain and neck stiffness.   Neurological:  Negative for light-headedness, numbness and headaches.     Objective:   Physical Exam   Constitutional:       General: He is not in acute distress.     Appearance: Normal appearance. He is not ill-appearing.   HENT:      Head: Normocephalic and atraumatic.   Eyes:      General: No scleral icterus.     Conjunctiva/sclera: Conjunctivae normal.   Pulmonary:  Effort: Pulmonary effort is normal. No respiratory distress.   Abdominal:      Palpations: Abdomen is soft.   Small epigastric hernia defect  Skin:     General: Skin is warm.      Coloration: Skin is not jaundiced.      Comments: Mid lower back with erythema, induration, central fluctuance with small stab wound to infected sebaceous cyst   Neurological:      Mental Status: He is alert.     Assessment/Plan:   Infected sebaceous cyst with abscess and surrounding cellulitis -   Improved after repeat incision and drainage - recommend completion of antibiotic course for 2weeks total given extent of cellulitis.  Discussed need excision of mass in future to prevent recurrence. Would not attempt for 2 months given extent of cellulitis and induration.   Epigastric hernia - discussed treatment options including observation vs repair. Amenable to primary repair. Discussed risks of recurrence including weight as well as need for lifting restrictions  postoperatively for 6 weeks.  Would recommend general anesthesia, outpatient procedure, ancef and SQH on call to OR if he desires repair.     Discussed options of proceeding with mass excision as procedure visit under local vs combining both procedures in OR under general.  He would like to discuss plan with wife - can call to schedule. Would obtain consent on day of procedure.   All questions addressed.    Garen Grams, MD  General Surgeon  [facility]      03/22/2023  8:28 PM

## 2023-03-19 ENCOUNTER — Ambulatory Visit: Payer: PRIVATE HEALTH INSURANCE

## 2023-03-19 ENCOUNTER — Other Ambulatory Visit: Payer: Self-pay | Admitting: Cardiology

## 2023-04-27 ENCOUNTER — Ambulatory Visit: Admit: 2023-04-27 | Discharge: 2023-04-27 | Payer: PRIVATE HEALTH INSURANCE

## 2023-04-27 DIAGNOSIS — L723 Sebaceous cyst: Secondary | ICD-10-CM

## 2023-04-27 MED ORDER — lidocaine-EPINEPHrine 0.5 %-1:200,000 injection 1 mL
0.5 | Freq: Once | INTRAMUSCULAR
Start: 2023-04-27 — End: 2023-04-27

## 2023-04-27 MED ORDER — lidocaine-EPINEPHrine 0.5 %-1:200,000 injection 10 mL
0.5 | Freq: Once | INTRAMUSCULAR | Status: AC
Start: 2023-04-27 — End: 2023-04-27
  Administered 2023-04-27: 20:00:00 10 mL

## 2023-04-27 NOTE — Progress Notes (Signed)
DATE OF SURGERY: 04/27/23     OPERATIVE REPORT     PREOPERATIVE DIAGNOSIS(ES):  back mass     POSTOPERATIVE DIAGNOSIS(ES):  same    SURGEON: Letti Towell T. Jerral Mccauley, MD    PROCEDURE(S) PERFORMED: [name] of back mass    ESTIMATED BLOOD LOSS:  minimal    SPECIMEN: back mass     COMPLICATIONS:  None.     INDICATION(S):  The patient is a 62 year old male, who presented with an in25fected back mass to the office.  This was consistent with a sebaceous cyst with abscess and was drained.  We recommended removal to prevent future infections, once his acute infection resolved. The patient desired removal. All risks/benefits were discussed with the patient regarding excision including risk of bleeding and wound infection.     DETAILS OF PROCEDURE(S):  After informed consent was obtained, the patient was brought to the procedure room.  The skin around the mass was prepped and draped in the usual standard fashion.  Timeout was taken confirming the patient, site and procedure to be undertaken.     Local anesthetic was injected around the mass. An elliptical incision was created. Sharp dissection was used to dissect around the 2cm mass circumferentially.  The mass was passed off the table and sent to pathology as specimen.  The wound was irrigated and hemostasis was obtained. The wound was then closed using 3-0 vicryl suture, steristrips, and a dry sterile dressing.  The patient tolerated the procedure well.  No complications.  I was present and performed the procedure.       Garen Grams, MD  General Surgeon  [facility]      05/03/2023  1:44 PM

## 2023-05-01 ENCOUNTER — Other Ambulatory Visit: Payer: Self-pay | Admitting: Cardiology

## 2023-06-17 ENCOUNTER — Other Ambulatory Visit: Payer: Self-pay | Admitting: Cardiology

## 2024-05-03 ENCOUNTER — Other Ambulatory Visit: Payer: Self-pay | Admitting: Cardiology

## 2024-06-11 NOTE — Telephone Encounter (Signed)
 RN LVM with patient stating who I was, why I was calling, and left my direct callback number as well. RN stated she would attempt to call him back as well. RN is speaking with MD when she arrives to office regarding plan since she does not have availability soon.

## 2024-06-11 NOTE — Telephone Encounter (Signed)
 RN called patient back since she missed his VM. RN informed patient that MD stated she wanted to see him in office tomorrow afternoon. Patient stated he is able to come tomorrow afternoon at 2:30 PM. RN confirmed this time as MD stated anything worked but morning times. RN will send message to clerical staff to add it to the calendar. All questions and concerns addressed at this time.

## 2024-06-11 NOTE — Telephone Encounter (Signed)
 RN attempted to return call to patient regarding what MD said. Patient did not answer. RN will attempt to call one final time.

## 2024-06-11 NOTE — Telephone Encounter (Signed)
 RN received VM from patient since she was on phone with another patient. Heaton was asking us  to give him a return phone call when able. RN calling now.

## 2024-06-11 NOTE — Telephone Encounter (Signed)
 Patient requesting established visit follow up. Callback number updated in patient chart. Pt said he has a cyst on his back which is filled with pus and starting to cause discomfort. Pt think he may need antibiotics. Patient is also requesting a sooner appointment. Please advise.

## 2024-06-12 ENCOUNTER — Ambulatory Visit: Admit: 2024-06-12 | Discharge: 2024-06-17 | Payer: PRIVATE HEALTH INSURANCE

## 2024-06-12 ENCOUNTER — Ambulatory Visit: Payer: PRIVATE HEALTH INSURANCE

## 2024-06-12 DIAGNOSIS — L02212 Cutaneous abscess of back [any part, except buttock]: Principal | ICD-10-CM

## 2024-06-12 DIAGNOSIS — L0291 Cutaneous abscess, unspecified: Principal | ICD-10-CM

## 2024-06-12 MED ORDER — cephALEXin (KEFLEX) 500 MG capsule
500 | ORAL_CAPSULE | Freq: Three times a day (TID) | ORAL | 0 refills | 7.00000 days
Start: 2024-06-12 — End: 2024-07-11

## 2024-06-12 MED ORDER — lidocaine-EPINEPHrine 0.5 %-1:200,000 injection 10 mL
0.5 | Freq: Once | INTRAMUSCULAR | Status: AC
Start: 2024-06-12 — End: 2024-06-12
  Administered 2024-06-12: 18:00:00

## 2024-06-12 NOTE — Progress Notes (Signed)
 Chief Complaint:    Chief Complaint   Patient presents with    Establish Care     Cyst on back, no imaging, self, uhc       Subjective   HPI:   Patient ID: Ronnie Collins is a 63 y.o. male.  HPI  Pt is a 63yo male, here for follow-up after I+D of infected sebaceous cyst followed by removal in 2024  Developed increase in pain and swelling in same location - concern for recurrent infection  No fever/chills. No spontaneous drainage - has not had issues since removal until this infection.          Allergies  Patient has no known drug allergies or adverse reactions.    Medications  Encounter Medications[1]     Histories  He has a past medical history of Hypertension.    He has no past surgical history other than cyst removal    His family history is not contributory    He reports that he has never smoked. He has never used smokeless tobacco. He reports that he does not currently use alcohol. He reports that he does not currently use drugs.    The following portions of the patient's history were reviewed and updated as appropriate: allergies, current medications, past family history, past medical history, past social history, past surgical history, problem list, and review of systems.    ROS:   Review of Systems   Constitutional:  Negative for appetite change, chills, fatigue and fever.   Respiratory:  Negative for shortness of breath and wheezing.    Cardiovascular:  Negative for chest pain.   Gastrointestinal:  Negative for abdominal distention, abdominal pain, constipation, diarrhea, nausea and vomiting.   Neurological:  Negative for dizziness, light-headedness and headaches.     Objective:   Physical Exam   Constitutional:       General: He is not in acute distress.     Appearance: Normal appearance. He is not ill-appearing.   HENT:      Head: Normocephalic and atraumatic.   Eyes:      General: No scleral icterus.     Conjunctiva/sclera: Conjunctivae normal.   Pulmonary:      Effort: Pulmonary effort is normal. No  respiratory distress.   Abdominal:      Palpations: Abdomen is soft.   Skin:     General: Skin is warm.      Coloration: Skin is not jaundiced.      Comments: Mid lower back with erythema, induration, central fluctuance    Neurological:      Mental Status: He is alert.      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 Recommend antibiotics - keflex  given.  Follow-up with me 2 weeks for wound check.  Will need excision of mass in future to prevent recurrence.  Recommend in OR under MAC/local given recurrence and depth of cyst.      Procedure note:  Consent obtained. Area prepped with betadine. Local anesthetic injected and incision expanded with drainage of copious sebum, purulence.  Expressed remaining cyst contents completely and wound irrigated.  Packed with 1/4inch packing tape and dressed with dry dressing.  Tolerated well.      Greig IVAR Raw, MD  General Surgeon  [facility]      06/13/2024  7:32 PM           [1]   Outpatient Encounter Medications as of 06/12/2024   Medication Sig Dispense Refill    amLODIPine (NORVASC) 5 MG tablet Take 1.5 tablets (7.5 mg total) by mouth at bedtime.      aspirin 81 mg Cap Take 81 mg by mouth at bedtime.      atorvastatin (LIPITOR) 40 MG tablet Take 1 tablet (40 mg total) by mouth at bedtime.      cholecalciferol, vitamin D3, (VITAMIN D3) 50 mcg (2,000 unit) Cap Take by mouth daily.      fenofibrate micronized (TRICOR) 54 MG tablet Take 1 tablet (54 mg total) by mouth daily.      lisinopriL (PRINIVIL) 40 MG tablet Take 1 tablet (40 mg total) by mouth daily.      loratadine (CLARITIN) 10 mg tablet Take 1 tablet (10 mg total) by mouth daily.      mv-min-folic-K1-lycopen-lutein (CENTRUM SILVER MEN) 300-60-600-300 mcg Tab Take by mouth daily.      ibuprofen  (MOTRIN ) 200 MG tablet Take 3  tablets (600 mg total) by mouth every 6 hours as needed for Pain. 30 tablet 0     No facility-administered encounter medications on file as of 06/12/2024.

## 2024-06-12 NOTE — Patient Instructions (Signed)
 Remove dressing in 24 hours -   Remove 1/4 inch packing at that time and re-dress daily with dry dressing until wound closes/is dry.   Wash with soap and water daily starting 24 hours from today.

## 2024-07-04 ENCOUNTER — Ambulatory Visit: Payer: PRIVATE HEALTH INSURANCE

## 2024-07-11 ENCOUNTER — Ambulatory Visit: Admit: 2024-07-11 | Discharge: 2024-07-11 | Payer: PRIVATE HEALTH INSURANCE

## 2024-07-11 ENCOUNTER — Other Ambulatory Visit: Admit: 2024-07-12 | Payer: PRIVATE HEALTH INSURANCE

## 2024-07-11 DIAGNOSIS — L0291 Cutaneous abscess, unspecified: Principal | ICD-10-CM

## 2024-07-11 DIAGNOSIS — R222 Localized swelling, mass and lump, trunk: Principal | ICD-10-CM

## 2024-07-11 LAB — ROUTINE CULTURE PLUS STAIN

## 2024-07-11 MED ORDER — cephALEXin (KEFLEX) 500 MG capsule
500 | ORAL_CAPSULE | Freq: Three times a day (TID) | ORAL | 0 refills | 7.00000 days | Status: AC
Start: 2024-07-11 — End: 2024-09-08

## 2024-07-11 MED ORDER — lidocaine-EPINEPHrine 0.5 %-1:200,000 injection 10 mL
0.5 | Freq: Once | INTRAMUSCULAR | Status: AC
Start: 2024-07-11 — End: 2024-07-11
  Administered 2024-07-11: 19:00:00 via INTRAMUSCULAR

## 2024-07-11 NOTE — Progress Notes (Signed)
 Chief Complaint:    Chief Complaint   Patient presents with    Follow-up     07/11/2024 visit with [name], MD for SUR EST - wound check, discuss possible surgery      Procedure     Abscess of back        Subjective   HPI:   Patient ID: Ronnie Collins is a 63 y.o. male.  HPI  Pt is a 63yo male, here for follow-up after I+D of infected sebaceous cyst followed by removal in 2024  Developed increase in pain and swelling in same location - concern for recurrent infection - had recent I+D several weeks ago.   No fever/chills.  No current antibiotic use.  Ready for removal.            Allergies  Patient has no known drug allergies or adverse reactions.    Medications  Encounter Medications[1]     Histories  He has a past medical history of Hypertension.    He has no past surgical history.    His family history is not contributory     He reports that he has never smoked. He has never used smokeless tobacco. He reports that he does not currently use alcohol. He reports that he does not currently use drugs.    The following portions of the patient's history were reviewed and updated as appropriate: allergies, current medications, past family history, past medical history, past social history, past surgical history, problem list, and review of systems.    ROS:   Review of Systems   Constitutional:  Negative for appetite change, chills, diaphoresis and fatigue.   HENT:  Negative for ear discharge, hearing loss, postnasal drip and rhinorrhea.    Eyes:  Negative for pain, redness and itching.   Respiratory:  Negative for cough, choking, chest tightness and shortness of breath.    Cardiovascular:  Negative for chest pain and leg swelling.   Gastrointestinal:  Negative for abdominal pain, anal bleeding, constipation, diarrhea, nausea and vomiting.   Musculoskeletal:  Negative for back pain, gait problem, joint swelling, neck pain and neck stiffness.   Skin:  Negative for pallor, rash and wound.   Neurological:  Negative for  weakness, light-headedness and headaches.   Psychiatric/Behavioral:  Negative for behavioral problems, confusion and decreased concentration.      Objective:   Physical Exam   Constitutional:       General: He is not in acute distress.     Appearance: Normal appearance. He is not ill-appearing.   HENT:      Head: Normocephalic and atraumatic.   Eyes:      General: No scleral icterus.     Conjunctiva/sclera: Conjunctivae normal.   Pulmonary:      Effort: Pulmonary effort is normal. No respiratory distress.   Abdominal:      Palpations: Abdomen is soft.   Skin:     General: Skin is warm.      Coloration: Skin is not jaundiced.      Comments: Mid lower back with erythema, induration, central fluctuance    Neurological:      Mental Status: He is alert.  Assessment/Plan:     1. Abscess        2. 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 Recommend antibiotics - keflex  given.  Follow-up with me in 3 weeks for removal of mass - plan in OR under MAC/local. Informed consent obtained after all risks/benefits discussed including recurrence and infection.  Will need prone positioning given location.  Will schedule.        Procedure note:  Consent obtained. Area prepped with betadine. Local anesthetic injected and incision expanded with drainage of copious sebum, purulence.  Expressed remaining cyst contents completely and wound irrigated.  Packed with 1/4inch packing tape and dressed with dry dressing.  Tolerated well.     Greig IVAR Raw, MD  General Surgeon  [facility]      07/17/2024  3:42 PM            Yasmin Bronaugh ONEIDA RAW, MD         [1]   Outpatient Encounter Medications as of 07/11/2024   Medication Sig Dispense Refill    amLODIPine (NORVASC) 5 MG tablet Take 1.5  tablets (7.5 mg total) by mouth at bedtime.      aspirin 81 mg Cap Take 81 mg by mouth at bedtime.      atorvastatin (LIPITOR) 40 MG tablet Take 1 tablet (40 mg total) by mouth at bedtime.      cholecalciferol, vitamin D3, (VITAMIN D3) 50 mcg (2,000 unit) Cap Take by mouth daily.      fenofibrate micronized (TRICOR) 54 MG tablet Take 1 tablet (54 mg total) by mouth daily.      lisinopriL (PRINIVIL) 40 MG tablet Take 1 tablet (40 mg total) by mouth daily.      loratadine (CLARITIN) 10 mg tablet Take 1 tablet (10 mg total) by mouth daily.      mv-min-folic-K1-lycopen-lutein (CENTRUM SILVER MEN) 300-60-600-300 mcg Tab Take by mouth daily.      cephALEXin  (KEFLEX ) 500 MG capsule Take 1 capsule (500 mg total) by mouth 3 times a day. (Patient not taking: Reported on 07/11/2024) 21 capsule 0     Facility-Administered Encounter Medications as of 07/11/2024   Medication Dose Route Frequency Provider Last Rate Last Admin    lidocaine -EPINEPHrine  0.5 %-1:200,000 injection 10 mL  10 mL Injection Once Keelie Zemanek T Tashera Montalvo, MD

## 2024-07-14 MED ORDER — lidocaine (PF) 2% (20 mg/mL) Soln 20 mg
20 | Freq: Once | INTRAMUSCULAR | Status: AC | PRN
Start: 2024-07-14 — End: 2024-07-14

## 2024-07-22 NOTE — Other (Signed)
 Procedures  EXCISION OF BACK MASS  Amy ONEIDA Raw, MD    Procedure is scheduled on 07/28/24. Phone screen sent as the patient needs a Holter monitor that was ordered by their PCP. Holter not yet scheduled. The patient also reported sob and mild chest pain with moving the lawn with decreased activity tolerance. Also reported slight lightheadedness with lawn mowing as well.     Per Dr. Mayo, the patient needs Holter completed and should be seen in [facility] for optimization to determine if Stress Test is needed.     Informed Dr. Raw and staff. Procedure to be cancelled for the OR at this time. Patient may opt to have procedure done in the office.     Patient informed of this. Asked to call our office if they wish to proceed to the OR.

## 2024-08-14 NOTE — Telephone Encounter (Signed)
 Hello,    The patient attached was unable to reach you in attempt to contact office.    Please contact this patient at your earliest convience.     Thank you.   Pt requested to speak with someone in Dr.Makley's office

## 2024-08-15 ENCOUNTER — Ambulatory Visit: Payer: PRIVATE HEALTH INSURANCE

## 2024-08-27 NOTE — Telephone Encounter (Signed)
-----   Message from [place] , RN sent at 08/15/2024 11:22 AM EDT -----  Regarding: RE: patient next steps  I looked back at my notes. Yes, he should be seen in [facility] for optimization. Believe they may want him to get a stress test but they will determine that when he's seen.  ----- Message -----  From: Rosina Sloop, MA  Sent: 08/15/2024  11:03 AM EDT  To: [place]  JINNY Quivers, RN  Subject: patient next steps                               Hey Georgia !    This patient cancelled their in office procedure bc he doesn't want it done under local and said he was advised that he needs a stress test. I asked dr. Marieta about a stress test but she said that it looks like he just needs to see anesthesia. I'm confused on what to do now, do I schedule him a visit with anesthesia or do we need a stress and if so, what do I need to do for that? I just don't want to waste anyone's time doing one thing if he needs another.     So sorry about this and I appreciate your help.     Thank you,  Rosina

## 2024-08-27 NOTE — Telephone Encounter (Signed)
 Called and lvm for patient offering 10/14 at 1030am for surgery arrival being 830am. I mentioned how we would have to have him see [facility] again and offered the phone number for them in case he wanted to schedule that. Asked for a return call to confirm that surgery date.

## 2024-09-08 MED ORDER — CEPHALEXIN 500 MG CAPSULE
500 | ORAL_CAPSULE | Freq: Three times a day (TID) | ORAL | 0 refills | 7.00000 days
Start: 2024-09-08 — End: 2024-09-17

## 2024-09-08 NOTE — Telephone Encounter (Signed)
 Called pt back to let him know that dr. Marieta would like to see him Friday, got him in for 3pm. Pt asked about antibiotics and I told him I could ask dr. Marieta and let him know what she says.

## 2024-09-08 NOTE — Telephone Encounter (Signed)
 Pt called in to the call center, he thinks his abscess is infected, I told him I would ask dr. Marieta if she can see him Friday if not sooner and I will reach back out to him.

## 2024-09-12 ENCOUNTER — Ambulatory Visit: Admit: 2024-09-12 | Discharge: 2024-09-12 | Payer: PRIVATE HEALTH INSURANCE

## 2024-09-12 MED ORDER — lidocainePF220mgmLSoln20mg
20 | Freq: Once | INTRAMUSCULAR | Status: AC | PRN
Start: 2024-09-12 — End: 2024-09-12

## 2024-09-12 NOTE — Progress Notes (Signed)
 Chief Complaint:    Chief Complaint   Patient presents with    Follow-up     discuss surgery vs procedure       Subjective   HPI:   Patient ID: Ronnie Collins is a 63 y.o. male.  HPI  Pt is a 63yo male, here for evaluation of recurrent infections of back cyst - notes developed increased pain and swelling.  Called in antibiotics with improvement.  Notes pain and swelling decreased. Had holter monitor and cleared for anesthesia. Prefers in OR given size and depth of mass. No fever/chills.        Allergies  Patient has no known drug allergies or adverse reactions.    Medications  Encounter Medications[1]     Histories  He has a past medical history of Hypertension.    He has no past surgical history on file.    His family history is not on file.    He reports that he has never smoked. He has never used smokeless tobacco. He reports that he does not currently use alcohol. He reports that he does not currently use drugs.    The following portions of the patient's history were reviewed and updated as appropriate: allergies, current medications, past family history, past medical history, past social history, past surgical history, problem list, and review of systems.    ROS:   Review of Systems   Constitutional:  Negative for appetite change, chills, diaphoresis and fatigue.   HENT:  Negative for ear discharge, hearing loss, postnasal drip and rhinorrhea.    Eyes:  Negative for pain, redness and itching.   Respiratory:  Negative for cough, choking, chest tightness and shortness of breath.    Cardiovascular:  Negative for chest pain and leg swelling.   Gastrointestinal:  Negative for abdominal pain, anal bleeding, constipation, diarrhea, nausea and vomiting.   Musculoskeletal:  Negative for back pain, gait problem, joint swelling, neck pain and neck stiffness.   Skin:  Negative for pallor, rash and wound.   Neurological:  Negative for weakness, light-headedness and headaches.   Psychiatric/Behavioral:  Negative for  behavioral problems, confusion and decreased concentration.      Objective:   Physical Exam  Constitutional:       General: He is not in acute distress.     Appearance: Normal appearance. He is not ill-appearing.   HENT:      Head: Normocephalic and atraumatic.   Eyes:      General: No scleral icterus.     Conjunctiva/sclera: Conjunctivae normal.   Pulmonary:      Effort: Pulmonary effort is normal. No respiratory distress.   Abdominal:      Palpations: Abdomen is soft.   Musculoskeletal:         General: Normal range of motion.   Skin:     General: Skin is warm.      Coloration: Skin is not jaundiced.      Comments: Lower back mass - erythema improved  No central fluctuance or induration   Neurological:      General: No focal deficit present.      Mental Status: He is alert. Mental status is at baseline.          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   Discussed plan for removal in OR under MAC given size and depth of cyst and recurrent infections.  Preop workup complete but need approval from anesthesia - [facility] visit arranged  Plan MAC/local, outpatient surgery - will re-schedule. Consent previously obtained  Instructed to call for any worsening infection prior to surgery date.    Greig IVAR Raw, MD  General Surgeon  [facility]      09/18/2024  8:01 PM         [1]   Outpatient Encounter Medications as of 09/12/2024   Medication Sig Dispense Refill    amLODIPine (NORVASC) 5 MG tablet Take 1.5 tablets (7.5 mg total) by mouth at bedtime.      aspirin 81 mg Cap Take 81 mg by mouth at bedtime.      atorvastatin (LIPITOR) 40 MG tablet Take 1 tablet (40 mg total) by mouth at bedtime.      cephALEXin  (KEFLEX ) 500 MG capsule Take 1 capsule (500 mg total) by mouth 3 times a day. 21 capsule 0    cholecalciferol, vitamin D3, (VITAMIN D3) 50 mcg (2,000 unit) Cap  Take by mouth daily.      fenofibrate micronized (TRICOR) 54 MG tablet Take 1 tablet (54 mg total) by mouth daily.      lisinopriL (PRINIVIL) 40 MG tablet Take 1 tablet (40 mg total) by mouth daily.      loratadine (CLARITIN) 10 mg tablet Take 1 tablet (10 mg total) by mouth daily.      mv-min-folic-K1-lycopen-lutein (CENTRUM SILVER MEN) 300-60-600-300 mcg Tab Take by mouth daily.       No facility-administered encounter medications on file as of 09/12/2024.

## 2024-09-17 ENCOUNTER — Ambulatory Visit: Admit: 2024-09-17 | Payer: PRIVATE HEALTH INSURANCE

## 2024-09-17 LAB — BASIC METABOLIC PANEL
Anion Gap: 10 mmol/L (ref 3–16)
BUN: 20 mg/dL (ref 7–25)
CO2: 25 mmol/L (ref 21–33)
Calcium: 9.3 mg/dL (ref 8.6–10.3)
Chloride: 105 mmol/L (ref 98–110)
Creatinine: 1.22 mg/dL (ref 0.60–1.30)
EGFR: 66
Glucose: 140 mg/dL — ABNORMAL HIGH (ref 70–100)
Osmolality, Calculated: 295 mosm/kg (ref 278–305)
Potassium: 4 mmol/L (ref 3.5–5.3)
Sodium: 140 mmol/L (ref 133–146)

## 2024-09-17 LAB — CBC
Hematocrit: 38.8 % (ref 38.5–50.0)
Hemoglobin: 13.4 g/dL (ref 13.2–17.1)
MCH: 28.7 pg (ref 27.0–33.0)
MCHC: 34.6 g/dL (ref 32.0–36.0)
MCV: 82.9 fL (ref 80.0–100.0)
MPV: 9.8 fL (ref 7.5–11.5)
Platelets: 206 10E3/uL (ref 140–400)
RBC: 4.67 10E6/uL (ref 4.20–5.80)
RDW: 14 % (ref 11.0–15.0)
WBC: 6.1 10E3/uL (ref 3.8–10.8)

## 2024-09-17 LAB — HEMOGLOBIN A1C: Hemoglobin A1C: 6.3 % — ABNORMAL HIGH (ref 4.0–5.6)

## 2024-09-17 NOTE — H&P (Signed)
 ANESTHESIOLOGY CONSULTATION AND PRE-OPERATIVE HISTORY AND PHYSICAL    Date of Surgery: 09/23/2024 [facility] OR  Surgeon:  Dr. Greig Raw      Diagnosis:  Mass on back  Procedure:  Excision of mass of back under MAC    Patient ID: Ronnie Collins is a 63 y.o. male.    Chief Complaint   Patient presents with    Pre-op Exam     Mass on back     Referral Indication: Risk stratification  History of Present Illness:  Ronnie Collins is a 63 y.o. male with a history of HTN, HLD, DM type II, CKD, recurrent sebaceous cyst, & OSA. Referred to general surgery for I&D of sebaceous cyst previously removed in 04/2023. Now to undergo Excision of mass of back under MAC.     Presents to [facility] clinic today for pre-op evaluation. Accompanied by spouse.     Medical History:     Past Medical History:   Diagnosis Date    Diabetes mellitus (CMS-HCC)     Hypertension     Sleep apnea        Surgical History:     Past Surgical History:   Procedure Laterality Date    FINGER SURGERY      SEPTOPLASTY      TONSILLECTOMY         Family History:     Family History   Problem Relation Age of Onset    Anesthesia problems Neg Hx        Allergies:   Allergies[1]    Medications:     Home Medications   Medication Sig Taking? Last Dose   amLODIPine (NORVASC) 5 MG tablet Take 1.5 tablets (7.5 mg total) by mouth at bedtime. Yes    aspirin 81 mg Cap Take 81 mg by mouth at bedtime. Yes    atorvastatin (LIPITOR) 40 MG tablet Take 1 tablet (40 mg total) by mouth at bedtime. Yes    fenofibrate micronized (TRICOR) 54 MG tablet Take 1 tablet (54 mg total) by mouth daily. Yes    lisinopriL (PRINIVIL) 40 MG tablet Take 1 tablet (40 mg total) by mouth daily. Yes    loratadine (CLARITIN) 10 mg tablet Take 1 tablet (10 mg total) by mouth daily. Yes    metFORMIN (FORTAMET) 500 MG (OSM) 24 hr tablet Take 1 tablet (500 mg total) by mouth 2 times a day with meals. Yes    mv-min-folic-K1-lycopen-lutein (CENTRUM SILVER MEN) 300-60-600-300 mcg Tab Take by mouth daily. Yes          Review of Systems   Constitutional:  Negative for activity change, appetite change, chills, fatigue, fever, weight gain and weight loss.   HENT:  Negative for congestion, dental problem, hearing loss, nosebleeds, rhinorrhea, sinus pressure, sore throat, tinnitus, trouble swallowing and voice change.    Eyes:  Negative for pain, redness, itching and visual disturbance.   Respiratory:  Negative for cough, chest tightness, shortness of breath and wheezing.    Cardiovascular:  Positive for leg swelling (occasional). Negative for chest pain and palpitations.        Reports able to walk several miles, ride recumbent bike, & climb 2-3 flights of stairs without experiencing shortness of breath. Reports 1 episode of chest hiccup in 06/2024, not chest pain. Denies recurrence Denies orthopnea.    Gastrointestinal:  Negative for abdominal pain, bloating, constipation, diarrhea, nausea and vomiting.   Genitourinary:  Negative for difficulty urinating, dysuria, flank pain, frequency, hematuria and nocturia.  Musculoskeletal:  Positive for arthralgias (right hip). Negative for back pain, gait problem, neck pain and neck stiffness.   Skin:  Negative for rash and wound.   Neurological:  Positive for numbness (feet). Negative for dizziness, tremors, seizures, syncope, facial asymmetry, speech difficulty, weakness, light-headedness and headaches.   Hematological:  Negative for adenopathy. Does not bruise/bleed easily.   Psychiatric/Behavioral:  Negative for depression and sleep disturbance. The patient is not nervous/anxious.        Objective:   Blood pressure 144/87, pulse 84, temperature 96.1 F (35.6 C), temperature source Temporal, resp. rate 16, weight (!) 293 lb (132.9 kg), SpO2 97%.    Physical Exam  Constitutional:       Appearance: Normal appearance. He is obese. He is not ill-appearing.   HENT:      Head: Normocephalic and atraumatic.      Nose: Nose normal.      Mouth/Throat:      Mouth: Mucous membranes are moist.       Pharynx: No oropharyngeal exudate.   Eyes:      Extraocular Movements: Extraocular movements intact.      Pupils: Pupils are equal, round, and reactive to light.   Neck:      Vascular: No carotid bruit.   Cardiovascular:      Rate and Rhythm: Normal rate and regular rhythm.      Pulses: Normal pulses.      Heart sounds: Normal heart sounds.   Pulmonary:      Effort: Pulmonary effort is normal. No respiratory distress.      Breath sounds: Normal breath sounds. No wheezing or rhonchi.   Abdominal:      General: Bowel sounds are normal. There is no distension.      Palpations: Abdomen is soft.      Tenderness: There is no abdominal tenderness.   Musculoskeletal:         General: Normal range of motion.      Cervical back: Normal range of motion and neck supple. No muscular tenderness.      Right lower leg: Edema (1+) present.      Left lower leg: Edema (1+) present.   Skin:     General: Skin is warm and dry.   Neurological:      General: No focal deficit present.      Mental Status: He is alert and oriented to person, place, and time.   Psychiatric:         Behavior: Behavior normal.         Judgment: Judgment normal.         Lab Review:   BMP:       Lab name 09/17/24  0752   SODIUM 140   POTASSIUM 4.0   CHLORIDE 105   CO2 25   BUN 20     CBC:       Lab name 09/17/24  0752   WBC 6.1   HEMOGLOBIN 13.4   HEMATOCRIT 38.8   MCH 28.7   PLATELETS 206     COAGS:     RENAL:      Lab name 09/17/24  0752   GLUCOSE 140*   BUN 20   CREATININE 1.22   SODIUM 140   POTASSIUM 4.0   CHLORIDE 105   CO2 25   CALCIUM 9.3   EGFR 66     HEP:     HGBA1C:       Lab name 09/17/24  9247  HEMOGLOBIN A1C 6.3*         Study Results:   Study Results:      Holter monitor 07/24/2024:                        Interpretive Statements   Patient monitored for 7d 6h, analyzable time was 7d 5h starting on 07/24/2024   09:07 am.   Primary rhythm was Sinus Rhythm. Average heart rate was 75 bpm, Minimum heart   rate was 53 bpm on Day 2 / 01:10:00 am, Max  heart rate was 126 bpm on Day 8 /   08:14:03 am   SVE(s): Burden was less than 0.01 %, 3 total SVE(s)   SV Arrhythmia(s):  1 event(s), longest event 3 beats on Day 4 / 02:12:24 pm,   fastest event 107 bpm on Day 4 / 02:12:24 pm   PVC(s): Burden was 1.39 %, 10867 total PVC(s), 2 disparate morphologies   Patient recorded 4 event(s) during the monitoring period   Patient events corresponded to sinus rhythm.   Electronically Signed On 08-04-2024 10:49:16 EDT by Lamar Heap, MD     MRI lumbar spine 03/05/2023:  IMPRESSION:     1. Rim-enhancing loculated fluid collection in the posterior midline lumbar subcutaneous soft tissues measuring up to 4 x 3 x 3 cm with differential including a subcutaneous abscess or infected sebaceous cyst. No associated deep myofascial or paraspinal soft tissue extension.   2.  Negative for epidural abscess or abnormal intrathecal enhancement.   3.  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     CT cardiac scoring 01/11/2022:  FINDINGS:   Vascular: Atherosclerosis of the descending thoracic aorta.     Mediastinum/Nodes: Visualized mediastinum and hilar regions   demonstrate no lymphadenopathy or masses.     Lungs/Pleura: Visualized lungs show no evidence of pulmonary edema,   consolidation, pneumothorax, nodule or pleural fluid.     Upper Abdomen: No acute abnormality.     Musculoskeletal: No chest wall mass or suspicious bone lesions   identified.     IMPRESSION:   Atherosclerosis of the descending thoracic aorta.     Anesthesia Considerations:   ASA Physical Status:  3    Assessment and Recommendations:       ICD-10-CM    1. Mass on back  R22.2       2. Pre-op evaluation  Z01.818 CBC     Basic metabolic panel     Hemoglobin J8r      3. Primary hypertension  I10       4. Hyperlipidemia, unspecified hyperlipidemia type  E78.5       5. Type 2 diabetes mellitus with other specified complication, without long-term current use of insulin (CMS-HCC)   E11.69       6. Stage 2 chronic kidney disease  N18.2       7. OSA (obstructive sleep apnea)  G47.33           Ronnie Collins is being seen prior to Excision of mass of back under MAC.     Concurrent medical conditions include:    Cardiac risk  - denies history of CAD/MI, CHF.   - Followed with [facility] cardiology in [place] in 2023 for elevated coronary calcium score of 201. Per notes, He is doing an excellent job on primary risk reduction. I suggested maybe another coronary calcium score in 5 years.   - Previous anesthesia phone screen  07/2024 revealed possible chest tightness & decreased activity tolerance in which anesthesiologist recommended completion of previously recommend Holter monitor per PCP as well as CPC evaluation.   - Holter monitor completed 07/2024: SR, PVC burden 1.39%  - On ASA 81 mg, statin/fenofibrate, ACE-I, CCB- continue perioperatively   - BLE 1+ edema on exam reports chronic, unchanged  - Reports able to walk several miles (recently walked around [employer]'s campus last weekend), ride recumbent bike, & climb 2-3 flights of stairs without experiencing shortness of breath. Reports 1 episode of chest hiccup in 06/2024, NOT chest pain. Denies recurrence. Denies orthopnea.   - Reports good functional status slightly limited by right hip pain. METs 4-6.  - D/w attending anesthesiologist: Low risk procedure, No cardiac testing indicated prior to OR.     HTN  BP Readings from Last 3 Encounters:   09/17/24 144/87   09/12/24 136/81   06/12/24 136/79   - taking amlodipine QHS & lisinopril QHS.    - Recommend continuing perioperatively.    HLD  - On atorvastatin & fenofibrate  - Recommend continuing perioperatively.    DMII  - Started on metformin 500 mg BID 08/2024  - Hgb A1c 6.7% on 04/30/24.   - Repeat A1c today  - Instructed to hold Metformin on AM of OR.     CKD  - Most recent GFR 66, Cr 1.23 on 04/30/24.   - Recommend renal dosing, maintaining adequate perfusion, euvolemia, and avoiding nephrotoxic  agents  - Repeat labs today    Mass on back  - Referred to general surgery for I&D of sebaceous cyst previously removed in 04/2023  - Now to undergo Excision of mass of back under MAC  - Completed Keflex     Obesity  - Body mass index is 39.74 kg/m.     OSA  - Follows with [facility] sleep medicine  - Using CPAP nightly  - Instructed to bring device on DOS    Metabolic equivalent of task/functional capacity: METs 4-6: stair climbing at slow pace, gardening, walking on level ground 3-3. , heavy household chores, golf/dancing, doubles tennis    Anesthetic considerations-   Pt denies hx of complications with previous anesthetics.    Airway exam: Mallampati III (soft and hard palate and base of uvula visible), Thyromental distance >3 finger breadths, opening >2 finger breadths. Teeth intact. Full neck ROM/short neck. + facial hair  PIV access: acceptable per visual inspection, denies hx of difficulty with IV placement.     I discussed this case with attending anesthesiologist, Dr. Marcey Gin, and he was in agreement with the above assessment and plan. No further workup recommended at this time.    I have conveyed my assessment and recommendations to the referring provider via shared EMR.    Pre-op instructions provided to patient per [facility].     Labs obtained today: CBC, BMP, Hgb A1c    Lamarr Gunner, [facility]  [phone]     Number and Complexity of Problems Addressed  2 or more stable chronic illnesses  1 acute, uncomplicated illness or injury    Amount and/or Complexity of Data to be Reviewed and Analyzed  3+ review of prior external notes from each unique source  3+ review of the results of each unique test  3+ unique tests ordered  Independent interpretation of a test performed by another physician/other qualified health care professional (not separately reported)    Risk of Complications and/or Morbidity or Mortality of Patient Management  Moderate    Time  I spent a total of 57 minutes  on the day of the visit.           [1] No Known Drug Allergies or Adverse Reactions

## 2024-09-17 NOTE — H&P (View-Only) (Signed)
 ANESTHESIOLOGY CONSULTATION AND PRE-OPERATIVE HISTORY AND PHYSICAL    Date of Surgery: 09/23/2024 [facility] OR  Surgeon:  Dr. Greig Raw      Diagnosis:  Mass on back  Procedure:  Excision of mass of back under MAC    Patient ID: Ronnie Collins is a 63 y.o. male.    Chief Complaint   Patient presents with    Pre-op Exam     Mass on back     Referral Indication: Risk stratification  History of Present Illness:  Ronnie Collins is a 63 y.o. male with a history of HTN, HLD, DM type II, CKD, recurrent sebaceous cyst, & OSA. Referred to general surgery for I&D of sebaceous cyst previously removed in 04/2023. Now to undergo Excision of mass of back under MAC.     Presents to [facility] clinic today for pre-op evaluation. Accompanied by spouse.     Medical History:     Past Medical History:   Diagnosis Date    Diabetes mellitus (CMS-HCC)     Hypertension     Sleep apnea        Surgical History:     Past Surgical History:   Procedure Laterality Date    FINGER SURGERY      SEPTOPLASTY      TONSILLECTOMY         Family History:     Family History   Problem Relation Age of Onset    Anesthesia problems Neg Hx        Allergies:   Allergies[1]    Medications:     Home Medications   Medication Sig Taking? Last Dose   amLODIPine (NORVASC) 5 MG tablet Take 1.5 tablets (7.5 mg total) by mouth at bedtime. Yes    aspirin 81 mg Cap Take 81 mg by mouth at bedtime. Yes    atorvastatin (LIPITOR) 40 MG tablet Take 1 tablet (40 mg total) by mouth at bedtime. Yes    fenofibrate micronized (TRICOR) 54 MG tablet Take 1 tablet (54 mg total) by mouth daily. Yes    lisinopriL (PRINIVIL) 40 MG tablet Take 1 tablet (40 mg total) by mouth daily. Yes    loratadine (CLARITIN) 10 mg tablet Take 1 tablet (10 mg total) by mouth daily. Yes    metFORMIN (FORTAMET) 500 MG (OSM) 24 hr tablet Take 1 tablet (500 mg total) by mouth 2 times a day with meals. Yes    mv-min-folic-K1-lycopen-lutein (CENTRUM SILVER MEN) 300-60-600-300 mcg Tab Take by mouth daily. Yes          Review of Systems   Constitutional:  Negative for activity change, appetite change, chills, fatigue, fever, weight gain and weight loss.   HENT:  Negative for congestion, dental problem, hearing loss, nosebleeds, rhinorrhea, sinus pressure, sore throat, tinnitus, trouble swallowing and voice change.    Eyes:  Negative for pain, redness, itching and visual disturbance.   Respiratory:  Negative for cough, chest tightness, shortness of breath and wheezing.    Cardiovascular:  Positive for leg swelling (occasional). Negative for chest pain and palpitations.        Reports able to walk several miles, ride recumbent bike, & climb 2-3 flights of stairs without experiencing shortness of breath. Reports 1 episode of chest hiccup in 06/2024, not chest pain. Denies recurrence Denies orthopnea.    Gastrointestinal:  Negative for abdominal pain, bloating, constipation, diarrhea, nausea and vomiting.   Genitourinary:  Negative for difficulty urinating, dysuria, flank pain, frequency, hematuria and nocturia.  Musculoskeletal:  Positive for arthralgias (right hip). Negative for back pain, gait problem, neck pain and neck stiffness.   Skin:  Negative for rash and wound.   Neurological:  Positive for numbness (feet). Negative for dizziness, tremors, seizures, syncope, facial asymmetry, speech difficulty, weakness, light-headedness and headaches.   Hematological:  Negative for adenopathy. Does not bruise/bleed easily.   Psychiatric/Behavioral:  Negative for depression and sleep disturbance. The patient is not nervous/anxious.        Objective:   Blood pressure 144/87, pulse 84, temperature 96.1 F (35.6 C), temperature source Temporal, resp. rate 16, weight (!) 293 lb (132.9 kg), SpO2 97%.    Physical Exam  Constitutional:       Appearance: Normal appearance. He is obese. He is not ill-appearing.   HENT:      Head: Normocephalic and atraumatic.      Nose: Nose normal.      Mouth/Throat:      Mouth: Mucous membranes are moist.       Pharynx: No oropharyngeal exudate.   Eyes:      Extraocular Movements: Extraocular movements intact.      Pupils: Pupils are equal, round, and reactive to light.   Neck:      Vascular: No carotid bruit.   Cardiovascular:      Rate and Rhythm: Normal rate and regular rhythm.      Pulses: Normal pulses.      Heart sounds: Normal heart sounds.   Pulmonary:      Effort: Pulmonary effort is normal. No respiratory distress.      Breath sounds: Normal breath sounds. No wheezing or rhonchi.   Abdominal:      General: Bowel sounds are normal. There is no distension.      Palpations: Abdomen is soft.      Tenderness: There is no abdominal tenderness.   Musculoskeletal:         General: Normal range of motion.      Cervical back: Normal range of motion and neck supple. No muscular tenderness.      Right lower leg: Edema (1+) present.      Left lower leg: Edema (1+) present.   Skin:     General: Skin is warm and dry.   Neurological:      General: No focal deficit present.      Mental Status: He is alert and oriented to person, place, and time.   Psychiatric:         Behavior: Behavior normal.         Judgment: Judgment normal.         Lab Review:   BMP:       Lab name 09/17/24  0752   SODIUM 140   POTASSIUM 4.0   CHLORIDE 105   CO2 25   BUN 20     CBC:       Lab name 09/17/24  0752   WBC 6.1   HEMOGLOBIN 13.4   HEMATOCRIT 38.8   MCH 28.7   PLATELETS 206     COAGS:     RENAL:      Lab name 09/17/24  0752   GLUCOSE 140*   BUN 20   CREATININE 1.22   SODIUM 140   POTASSIUM 4.0   CHLORIDE 105   CO2 25   CALCIUM 9.3   EGFR 66     HEP:     HGBA1C:       Lab name 09/17/24  9247  HEMOGLOBIN A1C 6.3*         Study Results:   Study Results:      Holter monitor 07/24/2024:                        Interpretive Statements   Patient monitored for 7d 6h, analyzable time was 7d 5h starting on 07/24/2024   09:07 am.   Primary rhythm was Sinus Rhythm. Average heart rate was 75 bpm, Minimum heart   rate was 53 bpm on Day 2 / 01:10:00 am, Max  heart rate was 126 bpm on Day 8 /   08:14:03 am   SVE(s): Burden was less than 0.01 %, 3 total SVE(s)   SV Arrhythmia(s):  1 event(s), longest event 3 beats on Day 4 / 02:12:24 pm,   fastest event 107 bpm on Day 4 / 02:12:24 pm   PVC(s): Burden was 1.39 %, 10867 total PVC(s), 2 disparate morphologies   Patient recorded 4 event(s) during the monitoring period   Patient events corresponded to sinus rhythm.   Electronically Signed On 08-04-2024 10:49:16 EDT by Lamar Heap, MD     MRI lumbar spine 03/05/2023:  IMPRESSION:     1. Rim-enhancing loculated fluid collection in the posterior midline lumbar subcutaneous soft tissues measuring up to 4 x 3 x 3 cm with differential including a subcutaneous abscess or infected sebaceous cyst. No associated deep myofascial or paraspinal soft tissue extension.   2.  Negative for epidural abscess or abnormal intrathecal enhancement.   3.  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     CT cardiac scoring 01/11/2022:  FINDINGS:   Vascular: Atherosclerosis of the descending thoracic aorta.     Mediastinum/Nodes: Visualized mediastinum and hilar regions   demonstrate no lymphadenopathy or masses.     Lungs/Pleura: Visualized lungs show no evidence of pulmonary edema,   consolidation, pneumothorax, nodule or pleural fluid.     Upper Abdomen: No acute abnormality.     Musculoskeletal: No chest wall mass or suspicious bone lesions   identified.     IMPRESSION:   Atherosclerosis of the descending thoracic aorta.     Anesthesia Considerations:   ASA Physical Status:  3    Assessment and Recommendations:       ICD-10-CM    1. Mass on back  R22.2       2. Pre-op evaluation  Z01.818 CBC     Basic metabolic panel     Hemoglobin J8r      3. Primary hypertension  I10       4. Hyperlipidemia, unspecified hyperlipidemia type  E78.5       5. Type 2 diabetes mellitus with other specified complication, without long-term current use of insulin (CMS-HCC)   E11.69       6. Stage 2 chronic kidney disease  N18.2       7. OSA (obstructive sleep apnea)  G47.33           Ronnie Collins is being seen prior to Excision of mass of back under MAC.     Concurrent medical conditions include:    Cardiac risk  - denies history of CAD/MI, CHF.   - Followed with [facility] cardiology in [place] in 2023 for elevated coronary calcium score of 201. Per notes, He is doing an excellent job on primary risk reduction. I suggested maybe another coronary calcium score in 5 years.   - Previous anesthesia phone screen  07/2024 revealed possible chest tightness & decreased activity tolerance in which anesthesiologist recommended completion of previously recommend Holter monitor per PCP as well as CPC evaluation.   - Holter monitor completed 07/2024: SR, PVC burden 1.39%  - On ASA 81 mg, statin/fenofibrate, ACE-I, CCB- continue perioperatively   - BLE 1+ edema on exam reports chronic, unchanged  - Reports able to walk several miles (recently walked around [employer]'s campus last weekend), ride recumbent bike, & climb 2-3 flights of stairs without experiencing shortness of breath. Reports 1 episode of chest hiccup in 06/2024, NOT chest pain. Denies recurrence. Denies orthopnea.   - Reports good functional status slightly limited by right hip pain. METs 4-6.  - D/w attending anesthesiologist: Low risk procedure, No cardiac testing indicated prior to OR.     HTN  BP Readings from Last 3 Encounters:   09/17/24 144/87   09/12/24 136/81   06/12/24 136/79   - taking amlodipine QHS & lisinopril QHS.    - Recommend continuing perioperatively.    HLD  - On atorvastatin & fenofibrate  - Recommend continuing perioperatively.    DMII  - Started on metformin 500 mg BID 08/2024  - Hgb A1c 6.7% on 04/30/24.   - Repeat A1c today  - Instructed to hold Metformin on AM of OR.     CKD  - Most recent GFR 66, Cr 1.23 on 04/30/24.   - Recommend renal dosing, maintaining adequate perfusion, euvolemia, and avoiding nephrotoxic  agents  - Repeat labs today    Mass on back  - Referred to general surgery for I&D of sebaceous cyst previously removed in 04/2023  - Now to undergo Excision of mass of back under MAC  - Completed Keflex     Obesity  - Body mass index is 39.74 kg/m.     OSA  - Follows with [facility] sleep medicine  - Using CPAP nightly  - Instructed to bring device on DOS    Metabolic equivalent of task/functional capacity: METs 4-6: stair climbing at slow pace, gardening, walking on level ground 3-3. , heavy household chores, golf/dancing, doubles tennis    Anesthetic considerations-   Pt denies hx of complications with previous anesthetics.    Airway exam: Mallampati III (soft and hard palate and base of uvula visible), Thyromental distance >3 finger breadths, opening >2 finger breadths. Teeth intact. Full neck ROM/short neck. + facial hair  PIV access: acceptable per visual inspection, denies hx of difficulty with IV placement.     I discussed this case with attending anesthesiologist, Dr. Marcey Gin, and he was in agreement with the above assessment and plan. No further workup recommended at this time.    I have conveyed my assessment and recommendations to the referring provider via shared EMR.    Pre-op instructions provided to patient per [facility].     Labs obtained today: CBC, BMP, Hgb A1c    Ronnie Collins, [facility]  [phone]     Number and Complexity of Problems Addressed  2 or more stable chronic illnesses  1 acute, uncomplicated illness or injury    Amount and/or Complexity of Data to be Reviewed and Analyzed  3+ review of prior external notes from each unique source  3+ review of the results of each unique test  3+ unique tests ordered  Independent interpretation of a test performed by another physician/other qualified health care professional (not separately reported)    Risk of Complications and/or Morbidity or Mortality of Patient Management  Moderate    Time  I spent a total of 57 minutes  on the day of the visit.           [1] No Known Drug Allergies or Adverse Reactions

## 2024-09-17 NOTE — Patient Instructions (Addendum)
 [facility]:   [address]   [place], [place] [address].   [phone] (phone); [phone] (fax)    Arrival Instructions    We're pleased that you have chosen [facility] for your upcoming procedure.  The staff serving you has been professionally trained to provide the highest quality care.  We encourage you to ask questions and to let the staff know of any special needs,as we want your visit to be as comfortable as possible.    Your procedure, as of today, is scheduled on 09/23/2024 at  10:30 AM.  Please arrive by 8:30 AM. You will check in at: [facility]: the registration area in the main lobby.    Please be aware that your surgeon's office will reach out to you regarding any changes to your date and time of surgery. Feel free to contact your surgeon's office 1-2 days prior to surgery to confirm.     Parking:  [employer]: the [place] located at [address]. (formerly [address]). *Parking tickets can be validated at the [employer] near the [employer].  *Valet available 6:00am-6:00pm for a fee       Fasting Instructions Prior to Surgery or Procedure Requiring Sedation    DO NOT EAT OR DRINK ANYTHING after midnight the night before your procedure.       Medication Instructions  Bring a list of your current medications, along with the dose and frequency. Please include any vitamins, herbal supplements, and over the counter medications you may be taking.  Your pre op nurse will need to know when your last dose of medication was taken.      FOLLOW THE INSTRUCTIONS BELOW REGARDING YOUR MEDICATIONS. If instructed to TAKE the medication on the morning of surgery, do so with a small sip of water.    Current Medications   Medication Instructions    amLODIPine (NORVASC) 5 MG tablet Take in the evening per your usual schedule    aspirin 81 mg Cap Take morning of surgery    atorvastatin (LIPITOR) 40 MG tablet Take  morning of surgery    fenofibrate micronized (TRICOR) 54 MG tablet Take morning of surgery    lisinopriL (PRINIVIL) 40 MG tablet Take in the evening per your usual schedule    loratadine (CLARITIN) 10 mg tablet Take if needed    metFORMIN (FORTAMET) 500 MG (OSM) 24 hr tablet Hold on day of surgery    mv-min-folic-K1-lycopen-lutein (CENTRUM SILVER MEN) 300-60-600-300 mcg Tab Start holding now before surgery             If your surgery date changes, stop dates listed above will also need to be adjusted.     Over-the-Counter Medication (OTC)  Instructions:  ? Stop taking over-the-counter blood thinners seven (7) to ten (10) days prior to your surgery.  Examples of over the counter blood thinners include:  Vitamin E and NSAIDs (Motrin , Naproxen, Aleve, Advil , & Ibuprofen ).    ? Stop taking Herbal Supplements and multivitamins for two (2) weeks prior to surgery.  ? Stop Chondroitin and glucosamine for two (2) days prior to surgery.  ? Stop using Cannabis products two (2) weeks prior to surgery, unless you are taking cannabis to treat seizures or for medicinal purpose, then please stop on the day of surgery.  ? Nicotine patches should be removed 24 hours in advance of the surgery.    ? It is OK to continue taking: Fish oil,  omege-3 fatty acids, melatonin, and supplements used to treat a specific deficiency (vitamin B12, iron, folate, calcium, magnesium or potassium.)  ? It is OK to take acetaminophen  (Tylenol ) if needed in the days leading up to surgery.     General Pre-Procedure Instructions  You will need a responsible adult to accompany you home from the hospital.  You will not be permitted to drive, take a taxi, bus, [place] or Lyft by yourself, as these do not count as reliable transportation. You will also need someone to stay with you for the first 24 hours after anesthesia.  We will ask you to provide the name, and a working phone number for your designated contact.  Please bring a photo ID and insurance information.  If you are being admitted to the hospital after surgery, you may bring a small overnight bag, but please do not bring any valuables.  If you have an IMPLANTED DEVICE, BRING THE REMOTE CONTROL for it.  Examples include:  bladder stimulators, pain stimulators, INSPIRE, etc.  If you have obstructive sleep apnea (OSA) and use a CPAP or BiPAP device, please bring this with you.  Pre-menopausal male patients will be screened and/or tested for pregnancy prior to your procedure given the potential risk of anesthesia and surgery to a fetus.  Please SHOWER the evening before surgery and again the morning of surgery using antibacterial soap (Dial).   Do not shave in the area of the surgery for 2 days prior to surgery.  If needed, a trained staff member will clip the area immediately before your surgery.      Additional Instructions  CONTACT INFORMATION:    For questions regarding your pre operative instructions, please call: [facility]: [phone]  There is a nurse available Monday through Friday, 8 a.m. to 4:30 p.m.  The office is closed on weekends and holidays.    After hours you may leave a voicemail, and someone will return your call on the next business day.    In an EMERGENCY, if you must reach someone after our office is closed, you may call same day surgery at [phone] [facility]) or [phone] [facility]) from 5:30 to 8 p.m.      SICK POLICY:    If you suspect that you have an illness/infection/rash currently, or, if you develop an illness or rash prior to surgery, please contact your surgeon immediately.  Any patient can request that we wear a mask, and we ask that you please respect that request.  After 8 p.m., urgent calls only may go to the operating room desk at [phone].    VISITOR POLICY:    Same day surgery is allowing no more than (two) 2 adult visitors per patient at any time.    Prior to having any visitors, the patient  will be checked in by the same day surgery nurse.   Children under the age of 13 are not permitted to visit.    Visiting hours in areas of the hospital will vary depending on specific unit, or floor of the hospital.       Day of Surgery Checklist:    The morning of your surgery:  ? Brush your teeth.   ? Shower: the morning of surgery with an antibacterial soap, such as Dial.   ? Do not use: powder, lotions, deodorant, perfume, and or cologne.   ? What to wear: Wear casual, loose fitting, and comfortable clothing.  ? Do  NOT wear: Any make-up, jewelry, watch, body piercings, powders, perfumes/colognes, dark nail polish.  ? take medications listed above  ? DO NOT shave in the area of surgery    Bring with you:  ? Bring a list of your medications and dose including herbal and over-the-counter medications.   ? Photo ID  ? Insurance card   ? Glasses and case (do not wear contact lenses on the day of your surgery)  ? Dentures and case   ? Hearing aids and case  ? Crutches, walker, or cane if you have these items and will need them after surgery.  ? Specific medications as described above (rescue inhaler, transplant, seizure, Parkinson's and transplant medications)  ? CPAP or BiPAP machine and all associated equipment except water  ? Remote control for any implanted device (nerve stimulator, bladder stimulator, INSPIRE, etc)  ? Other:   ? For an overnight stay - pack a small bag of items that you may need (ex. change of clothing, toiletries, phone charger).  Locker space is limited in the surgery area.    Leave at home:  ? Valuables:  We recommend that you leave valuables (ex. money, jewelry, credit cards) at home or with your family.   ? Contact lenses: Leave at home or bring a case for safe keeping.  ? Any make-up, jewelry, body piercings, powders, perfumes/colognes, dark nail polish  ? Please do not bring valuables such as money, jewelry or credit cards with you.

## 2024-09-19 MED ORDER — CEPHALEXIN 500 MG CAPSULE
500 | ORAL_CAPSULE | Freq: Three times a day (TID) | ORAL | 0 refills | 7.00000 days
Start: 2024-09-19 — End: 2024-09-23

## 2024-09-19 NOTE — Telephone Encounter (Addendum)
 Pt calling to report blood & pus coming out of the cyst on his back - req a return call to advise if he needs an abx. Pt has a scheduled procedure next week.

## 2024-09-22 NOTE — Telephone Encounter (Signed)
 [facility] PRESCRIPTION REFILL REQUEST   Medication(s) Dates     Last Prescribed date: 04/30/24, 07/09/24        Visit Information  LAST Office Visit:  (in my Department) Jul 30, 25   NEXT Office Visit:  (in my Department) 12/17/2024       Future Appointments   Date Time Provider Department Center   12/17/2024  9:00 AM Lennie Glendia LABOR., PA [facility]              PRESCRIPTION REFILLS REQUESTED     Pending Prescriptions Disp Refills    Fenofibrate 54 mg Tablet [Pharmacy Med Name: Fenofibrate 54 MG Oral Tablet] 120 Tablet 2     Sig: TAKE 1 TABLET BY MOUTH DAILY    amLODIPine (NORVASC) 5 mg tablet [Pharmacy Med Name: amLODIPine Besylate 5 MG Oral Tablet] 120 Tablet 2     Sig: TAKE 1 TABLET BY MOUTH DAILY        (Patient Preferred Pharmacy List):       [facility] [address] REE[place]ES ORA CANDIDA CHAROL OTHEL [place] [address] [address] [phone]    [address]    [place] [address]    Phone: [phone] Fax: [phone]    Hours: Not open 24 hours    [facility] [place], [place] [address]  [address] [phone]    [address]    [address]    [place] [address]    Phone: [phone] Fax: [phone]    Hours: Not open 24 hours

## 2024-09-23 LAB — POC GLU MONITORING DEVICE
POC Glucose Monitoring Device: 149 mg/dL — ABNORMAL HIGH (ref 70–100)
POC Glucose Monitoring Device: 117 mg/dL — ABNORMAL HIGH (ref 70–100)

## 2024-09-23 MED ORDER — PROPOFOL 10 MG/ML INTRAVENOUS EMULSION
10 | INTRAVENOUS | Status: AC
Start: 2024-09-23 — End: 2024-09-23

## 2024-09-23 MED ORDER — PROPOFOL INFUSION 10 MG/ML
10 | INTRAVENOUS | PRN
Start: 2024-09-23 — End: 2024-09-23
  Administered 2024-09-23: 15:00:00 via INTRAVENOUS

## 2024-09-23 MED ORDER — LIDOCAINE (PF) 20 MG/ML (2 %) INJECTION SOLUTION
20 | INTRAMUSCULAR | Status: AC
Start: 2024-09-23 — End: 2024-09-23

## 2024-09-23 MED ORDER — ONDANSETRON HCL (PF) 4 MG/2 ML INJECTION SOLUTION
4 | Freq: Once | INTRAMUSCULAR | PRN
Start: 2024-09-23 — End: 2024-09-23

## 2024-09-23 MED ORDER — FENTANYL (PF) 50 MCG/ML INJECTION SOLUTION
50 | INTRAMUSCULAR | PRN
Start: 2024-09-23 — End: 2024-09-23

## 2024-09-23 MED ORDER — OXYCODONE 5 MG TABLET
5 | ORAL | PRN
Start: 2024-09-23 — End: 2024-09-23

## 2024-09-23 MED ORDER — HYDROMORPHONE 0.5 MG/0.5 ML INJECTION SYRINGE
0.5 | INTRAMUSCULAR | PRN
Start: 2024-09-23 — End: 2024-09-23

## 2024-09-23 MED ORDER — ROCURONIUM 10 MG/ML INTRAVENOUS SOLUTION
10 | INTRAVENOUS | Status: AC
Start: 2024-09-23 — End: 2024-09-23

## 2024-09-23 MED ORDER — DEXAMETHASONE SODIUM PHOSPHATE 4 MG/ML INJECTION SOLUTION
4 | INTRAMUSCULAR | Status: AC
Start: 2024-09-23 — End: 2024-09-23

## 2024-09-23 MED ORDER — PROPOFOL 10 MG/ML FOR OR USE ONLY
10 | INTRAVENOUS | PRN
Start: 2024-09-23 — End: 2024-09-23
  Administered 2024-09-23 (×3): via INTRAVENOUS

## 2024-09-23 MED ORDER — DEXTROSE 10%-WATER 250 ML FOR HYPOGLYCEMIA
INTRAVENOUS | PRN
Start: 2024-09-23 — End: 2024-09-23

## 2024-09-23 MED ORDER — SODIUM CHLORIDE 0.9 % IRRIGATION SOLUTION
0.9 | PRN
Start: 2024-09-23 — End: 2024-09-23
  Administered 2024-09-23 (×2)

## 2024-09-23 MED ORDER — LIDOCAINE (PF) 20 MG/ML (2 %) INTRAVENOUS SOLUTION
20 | INTRAVENOUS | PRN
Start: 2024-09-23 — End: 2024-09-23
  Administered 2024-09-23: 15:00:00 via INTRAVENOUS

## 2024-09-23 MED ORDER — SODIUM CHLORIDE 0.9 % INTRAVENOUS SOLUTION
INTRAVENOUS | Status: AC | PRN
Start: 2024-09-23 — End: 2024-09-23
  Administered 2024-09-23: 14:00:00 via INTRAVENOUS

## 2024-09-23 MED ORDER — MIDAZOLAM 1 MG/ML INJECTION SOLUTION WRAPPER
1 | INTRAMUSCULAR | Status: AC
Start: 2024-09-23 — End: 2024-09-23

## 2024-09-23 MED ORDER — NALOXONE 0.4 MG/ML INJECTION SOLUTION
0.4 | INTRAMUSCULAR | PRN
Start: 2024-09-23 — End: 2024-09-23

## 2024-09-23 MED ORDER — CELECOXIB 200 MG CAPSULE
200 | ORAL | Status: AC | PRN
Start: 2024-09-23 — End: 2024-09-23
  Administered 2024-09-23: 13:00:00 via ORAL

## 2024-09-23 MED ORDER — OXYCODONE 5 MG TABLET
5 | ORAL | PRN
Start: 2024-09-23 — End: 2024-09-23
  Administered 2024-09-23: 16:00:00 via ORAL

## 2024-09-23 MED ORDER — GLUCOSE 4 GRAM CHEWABLE TABLET (WRAPPER)
4 | ORAL | PRN
Start: 2024-09-23 — End: 2024-09-23

## 2024-09-23 MED ORDER — FENTANYL (PF) 50 MCG/ML INJECTION SOLUTION
50 | INTRAMUSCULAR | Status: AC
Start: 2024-09-23 — End: 2024-09-23

## 2024-09-23 MED ORDER — ONDANSETRON HCL (PF) 4 MG/2 ML INJECTION SOLUTION
4 | INTRAMUSCULAR | Status: AC
Start: 2024-09-23 — End: 2024-09-23

## 2024-09-23 MED ORDER — MIDAZOLAM (PF) 1 MG/ML INJECTION SOLUTION
1 | INTRAMUSCULAR | PRN
Start: 2024-09-23 — End: 2024-09-23
  Administered 2024-09-23: 15:00:00 via INTRAVENOUS

## 2024-09-23 MED ORDER — OXYCODONE 5 MG TABLET
5 | ORAL_TABLET | Freq: Four times a day (QID) | ORAL | 0 refills | 4.00000 days | Status: AC | PRN
Start: 2024-09-23 — End: 2024-10-05
  Filled 2024-09-23: qty 12, 3d supply, fill #0

## 2024-09-23 MED ORDER — LACTATED RINGERS INTRAVENOUS SOLUTION
INTRAVENOUS
Start: 2024-09-23 — End: 2024-09-23

## 2024-09-23 MED ORDER — BUPIVACAINE (PF) 0.5 % (5 MG/ML) INJECTION SOLUTION
0.5 | INTRAMUSCULAR | PRN
Start: 2024-09-23 — End: 2024-09-23
  Administered 2024-09-23: 15:00:00 via INTRAMUSCULAR

## 2024-09-23 MED ORDER — PHENYLEPHRINE 10 MG/ML INJECTION SOLUTION
10 | INTRAMUSCULAR | PRN
Start: 2024-09-23 — End: 2024-09-23
  Administered 2024-09-23 (×4): via INTRAVENOUS

## 2024-09-23 MED ORDER — ACETAMINOPHEN 325 MG TABLET
325 | ORAL | Status: AC | PRN
Start: 2024-09-23 — End: 2024-09-23
  Administered 2024-09-23: 13:00:00 via ORAL

## 2024-09-23 MED ORDER — LACTATED RINGERS INTRAVENOUS SOLUTION
INTRAVENOUS | PRN
Start: 2024-09-23 — End: 2024-09-23
  Administered 2024-09-23: 14:00:00 via INTRAVENOUS

## 2024-09-23 MED FILL — DIPRIVAN 10 MG/ML INTRAVENOUS EMULSION: 10 10 mg/mL | INTRAVENOUS | Qty: 20 | Fill #0

## 2024-09-23 MED FILL — OXYCODONE 5 MG TABLET: 5 5 MG | ORAL | Qty: 1 | Fill #0

## 2024-09-23 MED FILL — MIDAZOLAM (PF) 1 MG/ML INJECTION SOLUTION: 1 1 mg/mL | INTRAMUSCULAR | Qty: 2 | Fill #0

## 2024-09-23 MED FILL — TYLENOL 325 MG TABLET: 325 325 mg | ORAL | Qty: 3 | Fill #0

## 2024-09-23 MED FILL — CELEBREX 200 MG CAPSULE: 200 200 mg | ORAL | Qty: 1 | Fill #0

## 2024-09-23 MED FILL — ROCURONIUM 10 MG/ML INTRAVENOUS SOLUTION: 10 10 mg/mL | INTRAVENOUS | Qty: 5 | Fill #0

## 2024-09-23 MED FILL — DEXAMETHASONE SODIUM PHOSPHATE 4 MG/ML INJECTION SOLUTION: 4 4 mg/mL | INTRAMUSCULAR | Qty: 1 | Fill #0

## 2024-09-23 MED FILL — ONDANSETRON HCL (PF) 4 MG/2 ML INJECTION SOLUTION: 4 4 mg/2 mL | INTRAMUSCULAR | Qty: 2 | Fill #0

## 2024-09-23 MED FILL — FENTANYL (PF) 50 MCG/ML INJECTION SOLUTION: 50 50 mcg/mL | INTRAMUSCULAR | Qty: 2 | Fill #0

## 2024-09-23 MED FILL — LIDOCAINE (PF) 20 MG/ML (2 %) INJECTION SOLUTION: 20 20 mg/mL (2 %) | INTRAMUSCULAR | Qty: 5 | Fill #0

## 2024-09-23 MED FILL — CEFAZOLIN 3 GRAM INTRAVENOUS SOLUTION: 3 3 gram | INTRAVENOUS | Qty: 1 | Fill #0

## 2024-09-23 NOTE — TOC Discharge Planning (AHS/AVS) (Signed)
 Anesthesia Transfer of Care Note    Patient: Ronnie Collins  Procedure(s) Performed: Procedure(s):  EXCISION OF MASS OF BACK    Patient location: Same Day Surgery    Anesthesia type: MAC    Airway Device on Arrival to PACU/ICU: Simple Face Mask    IV Access: Peripheral    Monitors Recommended to be Used During PACU/ICU: Standard Monitors    Outstanding Issues to Address: None    Level of Consciousness: awake, alert , and oriented    Post vital signs:    Vitals:    09/23/24 1136   BP: 116/73   Pulse: 72   Resp: 15   Temp: 97.2 F (36.2 C)   SpO2: 99%       Complications:  There were no known notable events for this encounter.    Date 09/22/24 0700 - 09/23/24 0659(Not Admitted) 09/23/24 0700 - 09/24/24 0659   Shift 0700-1459 1500-2259 2300-0659 24 Hour Total 0700-1459 1500-2259 2300-0659 24 Hour Total   INTAKE   I.V.     550(4.3)   550(4.3)     Volume (mL) (lactated Ringers IV infusion)     550   550   IV Piggyback     100   100     Volume (mL) (ceFAZolin 3 g in sodium chloride 0.9 % 100 mL [id number])     100   100   Shift Total(mL/kg)     650(5)   650(5)   OUTPUT   Shift Total(mL/kg)           Weight (kg)     129.3 129.3 129.3 129.3

## 2024-09-23 NOTE — Discharge Instructions (Signed)
 Acute Care Surgery Discharge Instructions    Please Call [phone] to confirm your follow-up appointment  No future appointments.    Activity:    Light activity  Get out of bed and walk frequently    Return to work:   May return to work as tolerated         Diet:   Regular diet     Drain/Dressing/Wound Care:  Leave sutures in place, these will be removed at your next appointment  Your wound is dressed with Adaptic, gauze and tegaderm. Keep this on for 48 hours and then remove and leave incision open to air.  You may shower 24 hours after surgery. Wash incision gently with soap and water and pat dry. Do not soak incisions in bath water or swim for two weeks.    Other Instructions:    If you are being discharged with medication to take as needed for pain, requests for refills may not be addressed at night or on the weekend.    If you are in severe pain, not relieved by your medication, please call or return to the Emergency Room.    No driving if taking narcotic pain medications    If you have questions after discharge please call the  Acute Care Surgery Hotline at [phone]  Identify yourself as an ACUTE CARE SURGERY patient and ask for the ACUTE CARE SURGERY RESIDENT on call.    Please call or return to the emergency room if you experience any of the following:    Worsening pain, nausea, or vomiting not relieved by medications  Temperature greater than 101 F. Fever/chills  Redness around incision/wound, drainage, or wound edge separation  Chest pain, shortness of breath, persistent dizziness, swelling in one or both legs

## 2024-09-23 NOTE — Other (Signed)
 Anesthesia Extubation Criteria:    Airway Device: endotracheal tube    Emergence Details:      Smooth      _x_      Stormy       __       Prolonged   __     Extubation Criteria:      Motor strength intact       _x_      Follows commands        _x_      Good airway reflexes      _x_      OP suctioned                  _x_        Follows commands:  Yes     Patient extubated:  Yes

## 2024-09-23 NOTE — Op Note (Signed)
 DATE OF SURGERY: 09/23/24     OPERATIVE REPORT     PREOPERATIVE DIAGNOSIS(ES):  back mass     POSTOPERATIVE DIAGNOSIS(ES):  same    SURGEON: Greig IVAR Raw, MD    PROCEDURE(S) PERFORMED:  1. excision of back mass    ESTIMATED BLOOD LOSS:  minimal    SPECIMEN: back mass     COMPLICATIONS:  None.     INDICATION(S):  The patient is a 63 year old male, who presented with a recurrent back mass to the office; this required multiple courses of antibiotics and incision/drainage procedures, consistent with a large infected sebaceous cyst.  The patient desired removal. All risks/benefits were discussed with the patient regarding excision including risk of bleeding and wound infection.     DETAILS OF PROCEDURE(S):  After informed consent was obtained, the patient was brought to the operating room.  He was positioned in the lateral position with all areas padded. A MAC anesthesia was achieved. The skin around the mass was prepped and draped in the usual standard fashion.  Timeout was taken confirming the patient, site and procedure to be undertaken.     Local anesthetic was injected around the mass. An incision was created. Sharp dissection and cautery were used to dissect around the 2cm mass circumferentially.  This was consistent with an infected sebaceous cyst. The mass was passed off the table and sent to pathology as specimen.  The wound was irrigated and hemostasis was obtained. The wound was then closed using 3-0 vicryl suture, interrupted 3-0 nylon sutures, and a dry sterile dressing. The patient tolerated the procedure well.  There were no complications. He was transferred off of the operating room table and brought to the recovery area in stable condition.  All needle, sponge, and instrument counts were correct at the conclusion of the procedure.       I was present for the entirety of the procedure.       Greig IVAR Raw, MD  General Surgeon  [facility]      09/24/2024  8:53 AM

## 2024-09-23 NOTE — Anesthesia Post-Procedure Evaluation (Signed)
 Anesthesia Post Note    Patient: Ronnie Collins    Procedure(s) Performed: Procedure(s):  EXCISION OF MASS OF BACK    Anesthesia type: MAC    Patient location: Same Day Surgery    Airway: Patent    Post pain: Adequate analgesia    Nausea / Vomiting: Absent    Post-operative Hydration Status: Adequate    Post assessment: no apparent anesthetic complications    Last Vitals:   Vitals:    09/23/24 1140 09/23/24 1145 09/23/24 1157 09/23/24 1200   BP: 113/65 123/80 141/85 141/85   BP Location:    Left upper arm   Patient Position:    Lying   BP Cuff Size:    Large   Pulse: 72 73 69 69   Resp: 25 19 18 14    Temp:    97.2 F (36.2 C)   TempSrc:    Temporal   SpO2: 99% 97% 98% 99%   Weight:       Height:            Last Temperature: 97.2 F (36.2 C) (09/23/2024 12:00 PM)      Post vital signs: stable    Level of consciousness: awake    Complications:  There were no known notable events for this encounter.

## 2024-09-23 NOTE — Anesthesia Pre-Procedure Evaluation (Addendum)
 [place]  DEPARTMENT OF ANESTHESIOLOGY  PRE-PROCEDURAL EVALUATION    Ronnie Collins is a 63 y.o. year old male presenting for:    Procedure(s):  EXCISION OF MASS OF BACK    Surgeon:   [name], MD    Chief Complaint     Mass on back [R22.2]    Review of Systems     Anesthesia Evaluation    CPC/PAT note reviewed.       No history of anesthetic complications         Cardiovascular:      (+) hyperlipidemia.    Hypertension is.        Neuro/Muscoloskeletal/Psych:        Pulmonary:          Sleep apnea on CPAP.         GI/Hepatic/Renal:          Chronic renal disease, CRI.    Endo/Other:      Diabetes, poorly controlled, type 2.    (-) no anemia, no thrombocytopenia.          Past Medical History     Past Medical History:   Diagnosis Date    Diabetes mellitus (CMS-HCC)     Hypertension     Sleep apnea        Past Surgical History     Past Surgical History:   Procedure Laterality Date    FINGER SURGERY      SEPTOPLASTY      TONSILLECTOMY         Family History     Family History   Problem Relation Age of Onset    Anesthesia problems Neg Hx        Social History     Social History     Socioeconomic History    Marital status: Married     Spouse name: Not on file    Number of children: Not on file    Years of education: Not on file    Highest education level: Not on file   Occupational History    Not on file   Tobacco Use    Smoking status: Never    Smokeless tobacco: Never   Vaping Use    Vaping status: Never Used   Substance and Sexual Activity    Alcohol use: Not Currently    Drug use: Not Currently    Sexual activity: Not Currently   Other Topics Concern    Not on file     Social Drivers of Health     Financial Resource Strain: Not on file   Food Insecurity: No Food Insecurity (09/17/2024)    Yearly Questionnaire     Do you need any assistance with obtaining housing, meals, medication, transportation or medical equipment?: No     Assistance needed for:: Not on file   Transportation Needs: No Transportation Needs  (09/17/2024)    Yearly Questionnaire     Do you need any assistance with obtaining housing, meals, medication, transportation or medical equipment?: No     Assistance needed for:: Not on file   Physical Activity: Not on file   Stress: Not on file   Social Connections: Not on file   Intimate Partner Violence: Not At Risk (09/23/2024)    Humiliation, Afraid, Rape, and Kick questionnaire     Fear of Current or Ex-Partner: No     Emotionally Abused: No     Physically Abused: No     Sexually Abused: No  Medications     Allergies:  Allergies[1]    Home Meds:  Home Medications   Medication Sig Taking? Last Dose   amLODIPine (NORVASC) 5 MG tablet Take 1.5 tablets (7.5 mg total) by mouth at bedtime. Yes 09/22/2024   aspirin 81 mg Cap Take 81 mg by mouth at bedtime. Yes 09/22/2024   atorvastatin (LIPITOR) 40 MG tablet Take 1 tablet (40 mg total) by mouth at bedtime. Yes 09/22/2024   cephALEXin  (KEFLEX ) 500 MG capsule Take 1 capsule (500 mg total) by mouth 3 times a day for 7 days. Yes 09/23/2024 Morning   cetirizine (ZYRTEC) 5 MG tablet Take 1 tablet (5 mg total) by mouth daily. Yes 09/23/2024 Morning   fenofibrate micronized (TRICOR) 54 MG tablet Take 1 tablet (54 mg total) by mouth daily. Yes 09/23/2024 Morning   lisinopriL (PRINIVIL) 40 MG tablet Take 1 tablet (40 mg total) by mouth daily. Yes 09/23/2024 Morning   metFORMIN (FORTAMET) 500 MG (OSM) 24 hr tablet Take 1 tablet (500 mg total) by mouth 2 times a day with meals. Yes 09/22/2024   omeprazole (PRILOSEC OTC) 20 MG tablet Take 1 tablet (20 mg total) by mouth every morning before breakfast. Yes 09/22/2024   psyllium (METAMUCIL) packet Take 1 packet by mouth daily. Yes 09/22/2024   loratadine (CLARITIN) 10 mg tablet Take 1 tablet (10 mg total) by mouth daily.     mv-min-folic-K1-lycopen-lutein (CENTRUM SILVER MEN) 300-60-600-300 mcg Tab Take by mouth daily.  09/19/2024         Inpatient Meds:  Scheduled:   Continuous:    lactated Ringers         PRN: acetaminophen ,  cefazolin, celecoxib, dextrose 10% in water **OR** dextrose 10% in water, glucose    Vital Signs     Wt Readings from Last 3 Encounters:   09/23/24 (!) 285 lb (129.3 kg)   09/17/24 (!) 293 lb (132.9 kg)   09/12/24 (!) 290 lb (131.5 kg)     Ht Readings from Last 3 Encounters:   09/23/24 6' (1.829 m)   09/12/24 6' (1.829 m)   07/11/24 6' (1.829 m)     Temp Readings from Last 3 Encounters:   09/23/24 96.9 F (36.1 C) (Temporal)   09/17/24 96.1 F (35.6 C) (Temporal)   03/05/23 99.1 F (37.3 C) (Oral)     BP Readings from Last 3 Encounters:   09/23/24 144/75   09/17/24 144/87   09/12/24 136/81     Pulse Readings from Last 3 Encounters:   09/23/24 84   09/17/24 84   09/12/24 76     SpO2 Readings from Last 3 Encounters:   09/23/24 95%   09/17/24 97%   09/12/24 95%       Physical Exam     Airway:     Mallampati: III    Dental:        Pulmonary:      Breathing: unlabored         Cardiovascular:     Rate: normal    Neuro/Musculoskeletal/Psych:     Mental status: alert and oriented to person, place and time.          Abdominal:     Obese.    Current OB Status:       Other Findings:        Laboratory Data     Lab Results   Component Value Date    WBC 6.1 09/17/2024    HGB 13.4 09/17/2024    HCT 38.8  09/17/2024    MCV 82.9 09/17/2024    PLT 206 09/17/2024       No results found for: [facility]    Lab Results   Component Value Date    GLUCOSE 140 (H) 09/17/2024    BUN 20 09/17/2024    CO2 25 09/17/2024    CREATININE 1.22 09/17/2024    K 4.0 09/17/2024    NA 140 09/17/2024    CL 105 09/17/2024    CALCIUM 9.3 09/17/2024       No results found for: PTT, INR    No results found for: PREGTESTUR, PREGSERUM, HCG, HCGQUANT    Anesthesia Plan     ASA 3       Current non-smoker          PONV Risk Factors: current non-smoker                            Plan discussed with CRNA and RNSA.               [1] No Known Drug Allergies or Adverse Reactions

## 2024-09-23 NOTE — Nursing Note (Signed)
 Pt arrived in SDS on RA, VSS, assessment stable. Discharge order and anesthesia sign out present. Family at bedside and patient dressed independently. Discharge instructions reviewed with no further questions from patient or family.  Patient transported to car via wheelchair and driven home by wife

## 2024-09-23 NOTE — H&P (Signed)
 ACS Attending Surgeon  I have personally performed a face to face evaluation on this patient on 09/23/2024.  The patient was initially seen by the CNP/Resident team.  I agree with the findings and plan of care documented in resident/NP note. My findings are as follows:    Infected back cyst - recurrent infections - to OR for removal. Required suppressive antibiotics in interim otherwise no other changes.     Greig IVAR Raw, MD  Acute Care Surgeon  Division of Trauma, Surgical Critical Care, and Acute Care Surgery  [facility]      For Transfers, call [phone]    09/23/2024  11:05 PM

## 2024-09-23 NOTE — Progress Notes (Signed)
 Patient feels well overall with no changes to symptoms, medications, diagnoses, or procedure history since his last H&P.    GEN: awake, alert, NAD  CV: RRR  RESP: nl effort, no distress  ABD: soft, non-distended, non-tender  SKIN: warm and dry  NEURO: A&Ox4, moves all extremities well, no focal deficits    Ok to proceed with surgery as planned.    SCHUYLER EVA RICHES, MD  General Surgery Resident

## 2024-09-23 NOTE — Brief Op Note (Signed)
 EXCISION OF MASS OF BACK  Brief Op Note  MARRIO SCRIBNER  09/23/2024      Pre-op Diagnosis: Mass on back [R22.2]       Post-op Diagnosis: Same    Procedure(s):  EXCISION OF MASS OF BACK      Surgeon(s):  Amy ONEIDA Raw, MD    Anesthesia: MAC (Monitor Anesthesia Care)    Staff:   Circulator: Penne Able, RN  Scrub Person: Lavanda Johns, RN  2nd Circulator: Jacquline Alert, RN  Resident: Schuyler Eva Riches, MD    Estimated Blood Loss: Minimal                 Specimens:   Specimens       ID Description Commments Type Source Tests Collected By Collected At    A A. Back mass s/p  Tissue Back SURGICAL PATHOLOGY EXAM   [name], MD 09/23/24 1054                   Drains:       This procedure was not performed to treat primary cutaneous melanoma through wide local excision                 There were no complications unless listed below.         SCHUYLER EVA RICHES, MD     Date: 09/23/2024  Time: 11:28 AM

## 2024-09-26 NOTE — Telephone Encounter (Signed)
 3rd attempt call

## 2024-09-26 NOTE — Telephone Encounter (Signed)
 Second request. Pt called and said he needs to speak to Dr [facility] nurse.

## 2024-09-26 NOTE — Telephone Encounter (Signed)
 Pt removed bandage over sutures on back - middle of incision has slightly opened and had serosanguinous fluid. Pt will send image via MyChart message.    Pt requesting return call to advise.

## 2024-09-26 NOTE — Telephone Encounter (Signed)
 Message already taken care of by Dr Marieta    Closing encounter at this time

## 2024-09-26 NOTE — Telephone Encounter (Signed)
 Called and discussed wound care plans - continue dry dressing to wound daily.  Fu with me next week. All questions addressed.     Greig IVAR Raw, MD  General Surgeon  [facility]      09/26/2024  4:44 PM

## 2024-10-03 ENCOUNTER — Ambulatory Visit: Admit: 2024-10-03 | Discharge: 2024-10-03 | Payer: PRIVATE HEALTH INSURANCE

## 2024-10-03 DIAGNOSIS — R222 Localized swelling, mass and lump, trunk: Principal | ICD-10-CM

## 2024-10-03 NOTE — Progress Notes (Signed)
 Chief Complaint:    Chief Complaint   Patient presents with    Post-op Evaluation     suture removal       Subjective   HPI:   Patient ID: Ronnie Collins is a 63 y.o. male.  HPI  Pt is a 63yo male, here for follow-up after excision of recurrent back mass - doing well  Pain improved. Small open area minimal drainage. No fever/chills.        Allergies  Patient has no known drug allergies or adverse reactions.    Medications  Encounter Medications[1]     Histories  He has a past medical history of Diabetes mellitus (CMS-HCC), Hypertension, and Sleep apnea.    He has a past surgical history that includes Finger surgery; Septoplasty; Tonsillectomy; and excision lesion mass (N/A, 09/23/2024).    His family history is not contributory     He reports that he has never smoked. He has never used smokeless tobacco. He reports that he does not currently use alcohol. He reports that he does not currently use drugs.    The following portions of the patient's history were reviewed and updated as appropriate: allergies, current medications, past family history, past medical history, past social history, past surgical history, problem list, and review of systems.    ROS:   Review of Systems   Constitutional:  Negative for chills, fatigue and fever.   Respiratory:  Negative for cough, chest tightness, shortness of breath and wheezing.    Cardiovascular:  Negative for chest pain.   Gastrointestinal:  Negative for constipation, diarrhea, nausea and vomiting.   Neurological:  Negative for dizziness, light-headedness and headaches.     Objective:   Physical Exam  Constitutional:       General: He is not in acute distress.     Appearance: Normal appearance. He is not ill-appearing.   HENT:      Head: Normocephalic and atraumatic.      Mouth/Throat:      Pharynx: Oropharynx is clear.   Eyes:      General: No scleral icterus.     Conjunctiva/sclera: Conjunctivae normal.   Pulmonary:      Effort: Pulmonary effort is normal. No respiratory  distress.   Abdominal:      General: There is no distension.      Palpations: Abdomen is soft.      Tenderness: There is no abdominal tenderness.   Skin:     General: Skin is warm.      Coloration: Skin is not jaundiced.      Comments: Back incision - c/d/I  Nylons removed  Small <1cm open area healing well   Neurological:      General: No focal deficit present.      Mental Status: He is alert. Mental status is at baseline.          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 Sutures removed.   Anticipate continued healing of small open area  [facility] for regular cleaning - can apply bandaid as needed   Follow-up with me PRN. All questions addressed.     Greig IVAR Raw, MD  General Surgeon  [facility]      10/06/2024  7:21 AM         [1]   Outpatient Encounter Medications as of 10/03/2024   Medication Sig Dispense Refill    amLODIPine (NORVASC) 5 MG tablet Take 1.5 tablets (7.5 mg  total) by mouth at bedtime.      aspirin 81 mg Cap Take 81 mg by mouth at bedtime.      atorvastatin (LIPITOR) 40 MG tablet Take 1 tablet (40 mg total) by mouth at bedtime.      cetirizine (ZYRTEC) 5 MG tablet Take 1 tablet (5 mg total) by mouth daily.      fenofibrate micronized (TRICOR) 54 MG tablet Take 1 tablet (54 mg total) by mouth daily.      lisinopriL (PRINIVIL) 40 MG tablet Take 1 tablet (40 mg total) by mouth daily.      loratadine (CLARITIN) 10 mg tablet Take 1 tablet (10 mg total) by mouth daily.      metFORMIN (FORTAMET) 500 MG (OSM) 24 hr tablet Take 1 tablet (500 mg total) by mouth 2 times a day with meals.      mv-min-folic-K1-lycopen-lutein (CENTRUM SILVER MEN) 300-60-600-300 mcg Tab Take by mouth daily.      omeprazole (PRILOSEC OTC) 20 MG tablet Take 1 tablet (20 mg total) by mouth every morning before breakfast.      oxyCODONE (ROXICODONE) 5 MG immediate release tablet Take 1 tablet (5 mg total) by mouth every 6 hours as needed for Pain for up to 12 days. 12  tablet 0    psyllium (METAMUCIL) packet Take 1 packet by mouth daily.      [EXPIRED] acetaminophen  (TYLENOL ) tablet 975 mg       [EXPIRED] ceFAZolin 3 g in sodium chloride 0.9 % 100 mL [id number]       [EXPIRED] celecoxib (CELEBREX) capsule 200 mg       [DISCONTINUED] BUPivacaine (PF) (SENSORCAINE) 0.5 % (5 mg/mL) 10 mL in lidocaine  (PF) (XYLOCAINE ) 10 mg/mL (1 %) 20 mL injection       [DISCONTINUED] dextrose 10%-water (D10W) IV soln       [DISCONTINUED] dextrose 10%-water (D10W) IV soln       [DISCONTINUED] fentaNYL (SUBLIMAZE) injection 12.5 mcg   0    [DISCONTINUED] fentaNYL (SUBLIMAZE) injection 12.5 mcg   0    [DISCONTINUED] fentaNYL (SUBLIMAZE) injection 25 mcg   0    [DISCONTINUED] glucose chewable tablet 12 g       [DISCONTINUED] HYDROmorphone  (DILAUDID ) injection 0.2 mg       [DISCONTINUED] HYDROmorphone  (DILAUDID ) injection 0.2 mg       [DISCONTINUED] HYDROmorphone  (DILAUDID ) injection 0.5 mg       [DISCONTINUED] lactated Ringers IV infusion       [DISCONTINUED] lactated Ringers IV infusion       [DISCONTINUED] lidocaine  (PF) 20 mg/mL (2 %) Soln       [DISCONTINUED] midazolam  (PF) (VERSED ) injection       [DISCONTINUED] naloxone (NARCAN) injection 0.04 mg       [DISCONTINUED] naloxone (NARCAN) injection 0.04 mg       [DISCONTINUED] ondansetron  (ZOFRAN ) injection 4 mg       [DISCONTINUED] oxyCODONE (ROXICODONE) immediate release tablet 2.5 mg   0    [DISCONTINUED] oxyCODONE (ROXICODONE) immediate release tablet 5 mg   0    [DISCONTINUED] phenylephrine (NEO-SYNEPHRINE) injection       [DISCONTINUED] propofol (DIPRIVAN) infusion 10 mg/mL       [DISCONTINUED] propofol 10 mg/ml (DIPRIVAN) injection       [DISCONTINUED] sodium chloride 0.9 % irrigation        No facility-administered encounter medications on file as of 10/03/2024.
